# Patient Record
Sex: Male | Born: 1985 | Race: Black or African American | Hispanic: No | Marital: Single | Smoking: Never smoker
Health system: Southern US, Community
[De-identification: ages and names within clinical notes are randomized; demographics above are authoritative.]

## PROBLEM LIST (undated history)

## (undated) DIAGNOSIS — E669 Obesity, unspecified: Secondary | ICD-10-CM

## (undated) DIAGNOSIS — I10 Essential (primary) hypertension: Secondary | ICD-10-CM

---

## 2012-03-28 ENCOUNTER — Emergency Department (HOSPITAL_COMMUNITY)
Admission: EM | Admit: 2012-03-28 | Discharge: 2012-03-28 | Disposition: A | Discharge status: Home / Self Care | Payer: Self-pay | Attending: Emergency Medicine | Admitting: Emergency Medicine

## 2012-03-28 ENCOUNTER — Encounter (HOSPITAL_COMMUNITY): Payer: Self-pay | Admitting: *Deleted

## 2012-03-28 DIAGNOSIS — J020 Streptococcal pharyngitis: Secondary | ICD-10-CM | POA: Insufficient documentation

## 2012-03-28 DIAGNOSIS — I10 Essential (primary) hypertension: Secondary | ICD-10-CM | POA: Insufficient documentation

## 2012-03-28 HISTORY — DX: Essential (primary) hypertension: I10

## 2012-03-28 LAB — RAPID STREP SCREEN (MED CTR MEBANE ONLY): Streptococcus, Group A Screen (Direct): POSITIVE — AB

## 2012-03-28 MED ORDER — PENICILLIN V POTASSIUM 250 MG PO TABS
500.0000 mg | ORAL_TABLET | Freq: Once | ORAL | Status: AC
  Administered 2012-03-28: 500 mg via ORAL
  Filled 2012-03-28: qty 2

## 2012-03-28 MED ORDER — IBUPROFEN 800 MG PO TABS
800.0000 mg | ORAL_TABLET | Freq: Once | ORAL | Status: AC
  Administered 2012-03-28: 800 mg via ORAL
  Filled 2012-03-28: qty 1

## 2012-03-28 MED ORDER — PENICILLIN V POTASSIUM 500 MG PO TABS: 500.0000 mg | ORAL_TABLET | Freq: Two times a day (BID) | ORAL | Status: AC

## 2012-03-28 NOTE — ED Notes (Signed)
Pt c/o sore throat with productive clear cough since yesterday, states "i think I have a fever"

## 2012-03-28 NOTE — ED Provider Notes (Signed)
History     CSN: [id number]  Arrival date & time 03/28/12  0436   First MD Initiated Contact with Patient 03/28/12 [phone]      Chief Complaint  Patient presents with  . Sore Throat    (Consider location/radiation/quality/duration/timing/severity/associated sxs/prior treatment) HPI 26 year old male presents to emergency room with complaint of sore throat. Patient reports onset of sore throat this morning around 345 while he was at work. Patient also reports cold chills and body aches associated with sore throat. He denies any sick contacts no headache. Patient is felt subjective fevers. Patient has had some intermittent cough as well. No rash, no abdominal pain, no chest pain no other symptoms. Past Medical History  Diagnosis Date  . Hypertension     History reviewed. No pertinent past surgical history.  History reviewed. No pertinent family history.  History  Substance Use Topics  . Smoking status: Never Smoker   . Smokeless tobacco: Not on file  . Alcohol Use: Yes      Review of Systems  All other systems reviewed and are negative.   other than listed in history of present illness  Allergies  Review of patient's allergies indicates not on file.  Home Medications   Current Outpatient Rx  Name Route Sig Dispense Refill  . AMLODIPINE BESYLATE 10 MG PO TABS Oral Take 10 mg by mouth daily.      Pulse 118  Temp 98.2 F (36.8 C)  Resp 16  SpO2 98%  Physical Exam  Nursing note and vitals reviewed. Constitutional: He appears well-developed and well-nourished. No distress.       Morbidly obese in no acute distress  HENT:  Head: Normocephalic and atraumatic.  Right Ear: External ear normal.  Left Ear: External ear normal.  Nose: Nose normal.  Mouth/Throat: Oropharynx is clear and moist.       No exudate or erythema noted to posterior pharynx or tonsils  Eyes: Conjunctivae and EOM are normal. Pupils are equal, round, and reactive to light.  Neck: Normal range of  motion. Neck supple. No JVD present. No tracheal deviation present. No thyromegaly present.  Cardiovascular: Normal rate, regular rhythm, normal heart sounds and intact distal pulses.  Exam reveals no gallop and no friction rub.   No murmur heard. Pulmonary/Chest: Effort normal and breath sounds normal. No stridor. No respiratory distress. He has no wheezes. He has no rales. He exhibits no tenderness.  Abdominal: Soft. Bowel sounds are normal. He exhibits no distension and no mass. There is no tenderness. There is no rebound and no guarding.  Musculoskeletal: Normal range of motion. He exhibits no edema and no tenderness.  Lymphadenopathy:    He has no cervical adenopathy.  Skin: Skin is warm and dry. No rash noted. He is not diaphoretic. No erythema. No pallor.    ED Course  Procedures (including critical care time)  Labs Reviewed  RAPID STREP SCREEN - Abnormal; Notable for the following:    Streptococcus, Group A Screen (Direct) POSITIVE (*)    All other components within normal limits   No results found.   1. Strep pharyngitis       MDM  26 year-old with acute onset of myalgias, sore throat. I have sent a strep screen, but feel this is more likely a viral syndrome.        Olivia Mackie, MD 03/28/12 [phone]

## 2012-03-28 NOTE — Discharge Instructions (Signed)
Take antibiotics as prescribed. Followup with your doctor for recheck in 3-5 days. Return to emergency department for worsening condition or new concerning symptoms.  Strep Infections Streptococcal (strep) infections are caused by streptococcal germs (bacteria). Strep infections are very contagious. Strep infections can occur in:  Ears.   The nose.   The throat.   Sinuses.   Skin.   Blood.   Lungs.   Spinal fluid.   Urine.  Strep throat is the most common bacterial infection in children. The symptoms of a Strep infection usually get better in 2 to 3 days after starting medicine that kills germs (antibiotics). Strep is usually not contagious after 36 to 48 hours of antibiotic treatment. Strep infections that are not treated can cause serious complications. These include gland infections, throat abscess, rheumatic fever and kidney disease. DIAGNOSIS  The diagnosis of strep is made by:  A culture for the strep germ.  TREATMENT  These infections require oral antibiotics for a full 10 days, an antibiotic shot or antibiotics given into the vein (intravenous, IV). HOME CARE INSTRUCTIONS   Be sure to finish all antibiotics even if feeling better.   Only take over-the-counter medicines for pain, discomfort and or fever, as directed by your caregiver.   Close contacts that have a fever, sore throat or illness symptoms should see their caregiver right away.   You or your child may return to work, school or daycare if the fever and pain are better in 2 to 3 days after starting antibiotics.  SEEK MEDICAL CARE IF:   You or your child has an oral temperature above 102 F (38.9 C).   Your baby is older than 3 months with a rectal temperature of 100.5 F (38.1 C) or higher for more than 1 day.   You or your child is not better in 3 days.  SEEK IMMEDIATE MEDICAL CARE IF:   You or your child has an oral temperature above 102 F (38.9 C), not controlled by medicine.   Your baby is  older than 3 months with a rectal temperature of 102 F (38.9 C) or higher.   Your baby is 87 months old or younger with a rectal temperature of 100.4 F (38 C) or higher.   There is a spreading rash.   There is difficulty swallowing or breathing.   There is increased pain or swelling.  Document Released: 11/17/2004 Document Revised: 09/29/2011 Document Reviewed: 08/26/2009 [facility] Patient Information 2012 [place], [place].

## 2012-03-28 NOTE — ED Notes (Signed)
PT ambulated with a  Steady gait; VSS; A&Ox3; no signs of distress; respirations even and unlabored; skin warm and dry; no questions at this time.  

## 2012-10-25 ENCOUNTER — Emergency Department (HOSPITAL_COMMUNITY): Payer: No Typology Code available for payment source

## 2012-10-25 ENCOUNTER — Emergency Department (HOSPITAL_COMMUNITY)
Admission: EM | Admit: 2012-10-25 | Discharge: 2012-10-26 | Disposition: A | Discharge status: Home / Self Care | Payer: No Typology Code available for payment source | Attending: Emergency Medicine | Admitting: Emergency Medicine

## 2012-10-25 ENCOUNTER — Encounter (HOSPITAL_COMMUNITY): Payer: Self-pay | Admitting: Emergency Medicine

## 2012-10-25 DIAGNOSIS — J029 Acute pharyngitis, unspecified: Secondary | ICD-10-CM | POA: Insufficient documentation

## 2012-10-25 DIAGNOSIS — I10 Essential (primary) hypertension: Secondary | ICD-10-CM | POA: Insufficient documentation

## 2012-10-25 DIAGNOSIS — J189 Pneumonia, unspecified organism: Secondary | ICD-10-CM

## 2012-10-25 DIAGNOSIS — R059 Cough, unspecified: Secondary | ICD-10-CM | POA: Insufficient documentation

## 2012-10-25 DIAGNOSIS — R05 Cough: Secondary | ICD-10-CM | POA: Insufficient documentation

## 2012-10-25 DIAGNOSIS — R5381 Other malaise: Secondary | ICD-10-CM | POA: Insufficient documentation

## 2012-10-25 DIAGNOSIS — Z79899 Other long term (current) drug therapy: Secondary | ICD-10-CM | POA: Insufficient documentation

## 2012-10-25 MED ORDER — DEXAMETHASONE SODIUM PHOSPHATE 10 MG/ML IJ SOLN
10.0000 mg | Freq: Once | INTRAMUSCULAR | Status: AC
  Administered 2012-10-25: 10 mg via INTRAMUSCULAR
  Filled 2012-10-25: qty 1

## 2012-10-25 MED ORDER — AMOXICILLIN 500 MG PO CAPS
1000.0000 mg | ORAL_CAPSULE | Freq: Once | ORAL | Status: AC
  Administered 2012-10-25: 1000 mg via ORAL
  Filled 2012-10-25: qty 2

## 2012-10-25 MED ORDER — AZITHROMYCIN 250 MG PO TABS: 500.0000 mg | ORAL_TABLET | Freq: Once | ORAL | Status: DC

## 2012-10-25 MED ORDER — ALBUTEROL SULFATE (5 MG/ML) 0.5% IN NEBU
5.0000 mg | INHALATION_SOLUTION | Freq: Once | RESPIRATORY_TRACT | Status: AC
  Administered 2012-10-25: 5 mg via RESPIRATORY_TRACT
  Filled 2012-10-25: qty 0.5

## 2012-10-25 MED ORDER — AZITHROMYCIN 250 MG PO TABS
500.0000 mg | ORAL_TABLET | Freq: Once | ORAL | Status: AC
  Administered 2012-10-25: 500 mg via ORAL
  Filled 2012-10-25: qty 2

## 2012-10-25 MED ORDER — IBUPROFEN 800 MG PO TABS
800.0000 mg | ORAL_TABLET | Freq: Once | ORAL | Status: AC
  Administered 2012-10-25: 800 mg via ORAL
  Filled 2012-10-25: qty 1

## 2012-10-25 NOTE — ED Notes (Signed)
Patient transported to X-ray 

## 2012-10-25 NOTE — ED Notes (Signed)
Patient complaining of cold/flu symptoms.  Reports sore throat, nasal congestion, and cough.  Unknown if he has been running a fever; denies generalized body aches, chest pain, shortness of breath, and chills.  Patient reports that he has been around a lot of sick people at his work.  States that he has not had the flu shot.

## 2012-10-26 MED ORDER — AMOXICILLIN 500 MG PO CAPS: 1000.0000 mg | ORAL_CAPSULE | Freq: Two times a day (BID) | ORAL | Status: DC

## 2012-10-26 MED ORDER — AZITHROMYCIN 250 MG PO TABS: 250.0000 mg | ORAL_TABLET | Freq: Every day | ORAL | Status: DC

## 2012-10-26 NOTE — ED Provider Notes (Signed)
History     CSN: [id number]  Arrival date & time 10/25/12  2109   First MD Initiated Contact with Patient 10/25/12 2140      Chief Complaint  Patient presents with  . Sore Throat  . Nasal Congestion    (Consider location/radiation/quality/duration/timing/severity/associated sxs/prior treatment) Patient is a 27 y.o. male presenting with pharyngitis. The history is provided by the patient. No language interpreter was used.  Sore Throat The current episode started in the past 7 days. The problem occurs daily. The problem has been gradually worsening. Associated symptoms include coughing and fatigue. Pertinent negatives include no abdominal pain, chest pain, fever, nausea, vomiting or weakness.   26yo morbidly obese male with upper respiratory symptoms x 8 days with sore throat cough and nasal congestion. pmh of hypertension.  Denies fever, nausea and vomiting.  Hypertensive tonight. Non compliant with norvasc today. No stroke symptoms. Neuro in tact.   Past Medical History  Diagnosis Date  . Hypertension     History reviewed. No pertinent past surgical history.  History reviewed. No pertinent family history.  History  Substance Use Topics  . Smoking status: Never Smoker   . Smokeless tobacco: Not on file  . Alcohol Use: Yes      Review of Systems  Constitutional: Positive for fatigue. Negative for fever.  HENT: Negative.   Eyes: Negative.   Respiratory: Positive for cough. Negative for shortness of breath.   Cardiovascular: Negative.  Negative for chest pain and leg swelling.  Gastrointestinal: Negative.  Negative for nausea, vomiting and abdominal pain.  Neurological: Negative.  Negative for weakness.  Psychiatric/Behavioral: Negative.   All other systems reviewed and are negative.    Allergies  Review of patient's allergies indicates no known allergies.  Home Medications   Current Outpatient Rx  Name  Route  Sig  Dispense  Refill  . AMLODIPINE BESYLATE 10 MG  PO TABS   Oral   Take 10 mg by mouth daily.         Marland Kitchen PHENYLEPH-DOXYLAMINE-DM-APAP 5-6.25-10-325 MG PO CAPS   Oral   Take 2 capsules by mouth at bedtime as needed. For cold symptoms           BP 179/114  Pulse 102  Temp 98.2 F (36.8 C) (Oral)  Resp 16  SpO2 100%  Physical Exam  Nursing note and vitals reviewed. Constitutional: He is oriented to person, place, and time. He appears well-developed and well-nourished.  HENT:  Head: Normocephalic.  Eyes: Conjunctivae normal and EOM are normal. Pupils are equal, round, and reactive to light.  Neck: Normal range of motion. Neck supple.  Cardiovascular: Normal rate and normal heart sounds.   Pulmonary/Chest: Effort normal.  Abdominal: Soft.  Musculoskeletal: Normal range of motion.  Neurological: He is alert and oriented to person, place, and time.  Skin: Skin is warm and dry.  Psychiatric: He has a normal mood and affect.    ED Course  Procedures (including critical care time)   Labs Reviewed  RAPID STREP SCREEN   Dg Chest 2 View  10/25/2012  *RADIOLOGY REPORT*  Clinical Data: Sore throat and nasal congestion.  CHEST - 2 VIEW  Comparison: None.  Findings: The heart size and mediastinal contours are normal. There is focal airspace disease in the right upper lobe consistent with pneumonia.  The lungs are otherwise clear.  There is no pleural effusion.  IMPRESSION: Focal right upper lobe pneumonia.   Original Report Authenticated By: Carey Bullocks, M.D.  No diagnosis found.    MDM  RU lobe pneumonia on x-ray reviewed by myself.  Albuterol neb, amoxicillin, zithromax and decadron in ER.  Sore throat with swelling - for strep.  Rx for azithromycin and amoxicillin.  He will follow up with his pcp in [place] tomorrow.  Work note.  Shared visit with Dr. Preston Fleeting. He will take his norvasc when he gets home.         Remi Haggard, NP 10/26/12 0234  Remi Haggard, NP 10/26/12 [phone]

## 2012-10-27 NOTE — ED Provider Notes (Signed)
Medical screening examination/treatment/procedure(s) were performed by non-physician practitioner and as supervising physician I was immediately available for consultation/collaboration.   Dione Booze, MD 10/27/12 [phone]

## 2013-04-14 ENCOUNTER — Encounter (HOSPITAL_BASED_OUTPATIENT_CLINIC_OR_DEPARTMENT_OTHER): Payer: Self-pay

## 2013-04-14 ENCOUNTER — Emergency Department (HOSPITAL_BASED_OUTPATIENT_CLINIC_OR_DEPARTMENT_OTHER)
Admission: EM | Admit: 2013-04-14 | Discharge: 2013-04-14 | Disposition: A | Discharge status: Home / Self Care | Payer: Self-pay | Attending: Emergency Medicine | Admitting: Emergency Medicine

## 2013-04-14 DIAGNOSIS — Z9114 Patient's other noncompliance with medication regimen: Secondary | ICD-10-CM

## 2013-04-14 DIAGNOSIS — K5289 Other specified noninfective gastroenteritis and colitis: Secondary | ICD-10-CM | POA: Insufficient documentation

## 2013-04-14 DIAGNOSIS — K529 Noninfective gastroenteritis and colitis, unspecified: Secondary | ICD-10-CM

## 2013-04-14 DIAGNOSIS — Z9119 Patient's noncompliance with other medical treatment and regimen: Secondary | ICD-10-CM | POA: Insufficient documentation

## 2013-04-14 DIAGNOSIS — Z91199 Patient's noncompliance with other medical treatment and regimen due to unspecified reason: Secondary | ICD-10-CM | POA: Insufficient documentation

## 2013-04-14 DIAGNOSIS — Z79899 Other long term (current) drug therapy: Secondary | ICD-10-CM | POA: Insufficient documentation

## 2013-04-14 DIAGNOSIS — I10 Essential (primary) hypertension: Secondary | ICD-10-CM | POA: Insufficient documentation

## 2013-04-14 DIAGNOSIS — R197 Diarrhea, unspecified: Secondary | ICD-10-CM | POA: Insufficient documentation

## 2013-04-14 LAB — BASIC METABOLIC PANEL
BUN: 12 mg/dL (ref 6–23)
Chloride: 101 mEq/L (ref 96–112)
Creatinine, Ser: 1 mg/dL (ref 0.50–1.35)
GFR calc Af Amer: >90 mL/min (ref >90)

## 2013-04-14 MED ORDER — POTASSIUM CHLORIDE CRYS ER 20 MEQ PO TBCR
40.0000 meq | EXTENDED_RELEASE_TABLET | Freq: Once | ORAL | Status: AC
  Administered 2013-04-14: 40 meq via ORAL
  Filled 2013-04-14: qty 2

## 2013-04-14 MED ORDER — HYDROCHLOROTHIAZIDE 25 MG PO TABS
25.0000 mg | ORAL_TABLET | Freq: Once | ORAL | Status: AC
  Administered 2013-04-14: 25 mg via ORAL
  Filled 2013-04-14: qty 1

## 2013-04-14 MED ORDER — POTASSIUM CHLORIDE 20 MEQ/15ML (10%) PO LIQD
20.0000 meq | Freq: Once | ORAL | Status: AC
  Administered 2013-04-14: 20 meq via ORAL
  Filled 2013-04-14: qty 15

## 2013-04-14 MED ORDER — POTASSIUM CHLORIDE CRYS ER 20 MEQ PO TBCR: 20.0000 meq | EXTENDED_RELEASE_TABLET | Freq: Two times a day (BID) | ORAL | Status: DC

## 2013-04-14 MED ORDER — SODIUM CHLORIDE 0.9 % IV BOLUS (SEPSIS)
1000.0000 mL | Freq: Once | INTRAVENOUS | Status: AC
  Administered 2013-04-14: 1000 mL via INTRAVENOUS

## 2013-04-14 MED ORDER — HYDROCHLOROTHIAZIDE 25 MG PO TABS: 25.0000 mg | ORAL_TABLET | Freq: Every day | ORAL | Status: DC

## 2013-04-14 MED ORDER — METOPROLOL SUCCINATE ER 100 MG PO TB24: 100.0000 mg | ORAL_TABLET | Freq: Every day | ORAL | Status: DC

## 2013-04-14 NOTE — ED Provider Notes (Signed)
History     CSN: [id number]  Arrival date & time 04/14/13  0201   First MD Initiated Contact with Patient 04/14/13 0232      Chief Complaint  Patient presents with  . Headache    (Consider location/radiation/quality/duration/timing/severity/associated sxs/prior treatment) HPI This is a 27 year old male with history of hypertension. He has not taken his antihypertensives in over a month due to finances. He is here with nausea, vomiting and diarrhea for the 3 days. The nausea and vomiting have resolved but he continues to have diarrhea. The symptoms have been moderate. He is also had an intermittent headache during this time, which responds to Motrin. He denies abdominal pain, chest pain, shortness of breath, blurred vision or any focal neurologic deficit.  Past Medical History  Diagnosis Date  . Hypertension     History reviewed. No pertinent past surgical history.  No family history on file.  History  Substance Use Topics  . Smoking status: Never Smoker   . Smokeless tobacco: Not on file  . Alcohol Use: Yes      Review of Systems  All other systems reviewed and are negative.    Allergies  Review of patient's allergies indicates no known allergies.  Home Medications   Current Outpatient Rx  Name  Route  Sig  Dispense  Refill  . amLODipine (NORVASC) 10 MG tablet   Oral   Take 10 mg by mouth daily.         Marland Kitchen olmesartan-hydrochlorothiazide (BENICAR HCT) 40-25 MG per tablet   Oral   Take 1 tablet by mouth daily.           BP 196/120  Pulse 93  Temp(Src) 98.7 F (37.1 C) (Oral)  Resp 16  Wt 364 lb (165.109 kg)  SpO2 100%  Physical Exam General: Well-developed, morbidly obese male in no acute distress; appearance consistent with age of record HENT: normocephalic, atraumatic Eyes: pupils equal round and reactive to light; extraocular muscles intact Neck: supple Heart: regular rate and rhythm Lungs: clear to auscultation bilaterally Abdomen: soft;  nondistended; nontender; bowel sounds present Extremities: No deformity; full range of motion Neurologic: Awake, alert and oriented; motor function intact in all extremities and symmetric; no facial droop Skin: Warm and dry Psychiatric: Normal mood and affect    ED Course  Procedures (including critical care time)     MDM   Nursing notes and vitals signs, including pulse oximetry, reviewed.  Summary of this visit's results, reviewed by myself:  Labs:  Results for orders placed during the hospital encounter of 04/14/13 (from the past 24 hour(s))  BASIC METABOLIC PANEL     Status: Abnormal   Collection Time    04/14/13  2:41 AM      Result Value Range   Sodium 139  135 - 145 mEq/L   Potassium 2.9 (*) 3.5 - 5.1 mEq/L   Chloride 101  96 - 112 mEq/L   CO2 27  19 - 32 mEq/L   Glucose, Bld 110 (*) 70 - 99 mg/dL   BUN 12  6 - 23 mg/dL   Creatinine, Ser 4.09  0.50 - 1.35 mg/dL   Calcium 9.4  8.4 - 81.1 mg/dL   GFR calc non Af Amer >90  >90 mL/min   GFR calc Af Amer >90  >90 mL/min   4:13 AM Feels better after IV fluid bolus. We will replete his potassium. We'll restart him on antihypertensives.  Hanley Seamen, MD 04/14/13 [phone]

## 2013-04-14 NOTE — ED Notes (Signed)
Patient here with generalized headache since Thursday that he describes as intermittent. Also reports nausea and diarrhea for same. Has not taken BP meds x 1 month due to financial reasons

## 2013-08-26 ENCOUNTER — Emergency Department (HOSPITAL_COMMUNITY)
Admission: EM | Admit: 2013-08-26 | Discharge: 2013-08-26 | Disposition: A | Discharge status: Home / Self Care | Payer: PRIVATE HEALTH INSURANCE | Attending: Emergency Medicine | Admitting: Emergency Medicine

## 2013-08-26 ENCOUNTER — Encounter (HOSPITAL_COMMUNITY): Payer: Self-pay | Admitting: Emergency Medicine

## 2013-08-26 DIAGNOSIS — H109 Unspecified conjunctivitis: Secondary | ICD-10-CM

## 2013-08-26 DIAGNOSIS — I10 Essential (primary) hypertension: Secondary | ICD-10-CM | POA: Insufficient documentation

## 2013-08-26 DIAGNOSIS — Z79899 Other long term (current) drug therapy: Secondary | ICD-10-CM | POA: Insufficient documentation

## 2013-08-26 MED ORDER — POLYMYXIN B-TRIMETHOPRIM 10000-0.1 UNIT/ML-% OP SOLN
1.0000 [drp] | OPHTHALMIC | Status: DC
  Filled 2013-08-26: qty 10

## 2013-08-26 MED ORDER — ERYTHROMYCIN 5 MG/GM OP OINT
TOPICAL_OINTMENT | Freq: Every day | OPHTHALMIC | Status: DC
  Filled 2013-08-26: qty 3.5

## 2013-08-26 NOTE — ED Notes (Signed)
Pt c/o right eye itching and drainage starting on yesterday.

## 2013-08-26 NOTE — ED Provider Notes (Addendum)
CSN: [id number]     Arrival date & time 08/26/13  1045 History   First MD Initiated Contact with Patient 08/26/13 1130     Chief Complaint  Patient presents with  . Conjunctivitis   (Consider location/radiation/quality/duration/timing/severity/associated sxs/prior Treatment) Patient is a 27 y.o. male presenting with conjunctivitis.  Conjunctivitis This is a new problem. The current episode started yesterday. The problem occurs constantly. The problem has been gradually worsening. Associated symptoms comments: Redness, itching and draining of the right eye. Nothing aggravates the symptoms. Nothing relieves the symptoms. He has tried nothing for the symptoms. The treatment provided no relief.    Past Medical History  Diagnosis Date  . Hypertension    History reviewed. No pertinent past surgical history. History reviewed. No pertinent family history. History  Substance Use Topics  . Smoking status: Never Smoker   . Smokeless tobacco: Not on file  . Alcohol Use: Yes    Review of Systems  Eyes: Positive for discharge, redness and itching.  All other systems reviewed and are negative.    Allergies  Review of patient's allergies indicates no known allergies.  Home Medications   Current Outpatient Rx  Name  Route  Sig  Dispense  Refill  . hydrochlorothiazide (HYDRODIURIL) 25 MG tablet   Oral   Take 25 mg by mouth daily.         . metoprolol succinate (TOPROL-XL) 100 MG 24 hr tablet   Oral   Take 100 mg by mouth daily. Take with or immediately following a meal.          BP 217/119  Pulse 96  Temp(Src) 97.8 F (36.6 C) (Oral)  Resp 18  Ht 5\' 11"  (1.803 m)  Wt 364 lb (165.109 kg)  BMI 50.79 kg/m2  SpO2 99% Physical Exam  Nursing note and vitals reviewed. Constitutional: He is oriented to person, place, and time. He appears well-developed and well-nourished. No distress.  HENT:  Head: Normocephalic and atraumatic.  Eyes: EOM are normal. Pupils are equal, round,  and reactive to light. Right eye exhibits discharge and exudate. Left eye exhibits no chemosis, no exudate and no hordeolum. No foreign body present in the left eye. Right conjunctiva is injected. Left conjunctiva is not injected. Left conjunctiva has no hemorrhage.  Cardiovascular: Normal rate.   Pulmonary/Chest: Effort normal.  Neurological: He is alert and oriented to person, place, and time.  Skin: Skin is warm and dry.  Psychiatric: He has a normal mood and affect. His behavior is normal.    ED Course  Procedures (including critical care time) Labs Review Labs Reviewed - No data to display Imaging Review No results found.  EKG Interpretation   None       MDM   1. Hypertension   2. Conjunctivitis     Patient with evidence of uncomplicated right conjunctivitis. Patient given medications for what appears to be an infectious conjunctivitis. Patient also is hypertensive here however he denies headache, chest pain, shortness of breath. He has taken his hydrochlorothiazide and metoprolol but states that his blood pressure always runs high and he has not checked it lately. Discussed with him if blood pressure remains elevated he needs to followup with his PCP for ongoing management. Do not feel patient needs any further treatment today for blood pressure    Gwyneth Sprout, MD 08/26/13 1211  Gwyneth Sprout, MD 08/26/13 1213

## 2013-08-26 NOTE — Progress Notes (Signed)
[facility] provided pt with a list of primary care resources and a [facility] [employer] [facility] application.  °

## 2014-04-25 ENCOUNTER — Encounter (HOSPITAL_COMMUNITY): Payer: Self-pay | Admitting: Emergency Medicine

## 2014-04-25 ENCOUNTER — Emergency Department (HOSPITAL_COMMUNITY)
Admission: EM | Admit: 2014-04-25 | Discharge: 2014-04-26 | Disposition: A | Discharge status: Home / Self Care | Payer: PRIVATE HEALTH INSURANCE | Attending: Emergency Medicine | Admitting: Emergency Medicine

## 2014-04-25 DIAGNOSIS — R05 Cough: Secondary | ICD-10-CM | POA: Insufficient documentation

## 2014-04-25 DIAGNOSIS — I10 Essential (primary) hypertension: Secondary | ICD-10-CM | POA: Insufficient documentation

## 2014-04-25 DIAGNOSIS — J020 Streptococcal pharyngitis: Secondary | ICD-10-CM

## 2014-04-25 DIAGNOSIS — R059 Cough, unspecified: Secondary | ICD-10-CM | POA: Insufficient documentation

## 2014-04-25 DIAGNOSIS — R509 Fever, unspecified: Secondary | ICD-10-CM | POA: Insufficient documentation

## 2014-04-25 DIAGNOSIS — Z79899 Other long term (current) drug therapy: Secondary | ICD-10-CM | POA: Insufficient documentation

## 2014-04-25 DIAGNOSIS — R51 Headache: Secondary | ICD-10-CM | POA: Insufficient documentation

## 2014-04-25 LAB — RAPID STREP SCREEN (MED CTR MEBANE ONLY): STREPTOCOCCUS, GROUP A SCREEN (DIRECT): POSITIVE — AB

## 2014-04-25 MED ORDER — PENICILLIN G BENZATHINE 1200000 UNIT/2ML IM SUSP
1.2000 10*6.[IU] | Freq: Once | INTRAMUSCULAR | Status: AC
  Administered 2014-04-25: 1.2 10*6.[IU] via INTRAMUSCULAR
  Filled 2014-04-25: qty 2

## 2014-04-25 NOTE — ED Provider Notes (Signed)
CSN: [id number]     Arrival date & time 04/25/14  2242 History  This chart was scribed for non-physician provider Jaynie Crumbleatyana Jerrald Doverspike, PA-C, working with Derwood KaplanAnkit Nanavati, MD by Phillis HaggisGabriella Gaje, ED Scribe. This patient was seen in room WTR9/WTR9 and patient care was started at 11:39 PM.    Chief Complaint  Patient presents with  . Sore Throat  . Flu like symptoms    The history is provided by the patient. No language interpreter was used.  HPI Comments: Edwin Lam is a 28 y.o. Male with a history of HTN and strep throat who presents to the Emergency Department complaining of fever, headache, cough, rhinorrhea, sore throat, chills and body aches onset two days ago. He reports that he has had associated pain with swallowing. He reports that he has been taking ibuprofen for the pain and fever. He denies smoking.    Past Medical History  Diagnosis Date  . Hypertension    History reviewed. No pertinent past surgical history. No family history on file. History  Substance Use Topics  . Smoking status: Never Smoker   . Smokeless tobacco: Not on file  . Alcohol Use: Yes    Review of Systems  Constitutional: Positive for fever and chills.  HENT: Positive for rhinorrhea and sore throat.   Respiratory: Positive for cough.   Musculoskeletal: Positive for myalgias.  Neurological: Positive for headaches.   Allergies  Review of patient's allergies indicates no known allergies.  Home Medications   Prior to Admission medications   Medication Sig Start Date End Date Taking? Authorizing Provider  hydrochlorothiazide (HYDRODIURIL) 25 MG tablet Take 25 mg by mouth daily. 04/14/13  Yes John L Molpus, MD  metoprolol (LOPRESSOR) 50 MG tablet Take 50 mg by mouth 2 (two) times daily.   Yes Historical Provider, MD   BP 156/103  Pulse 110  Temp(Src) 98.9 F (37.2 C) (Oral)  Resp 20  SpO2 97% Physical Exam  Nursing note and vitals reviewed. Constitutional: He is oriented to person, place, and time.  He appears well-developed and well-nourished.  HENT:  Head: Normocephalic and atraumatic.  Right Ear: External ear and ear canal normal.  Left Ear: Tympanic membrane, external ear and ear canal normal.  Nose: Nose normal.  Mouth/Throat: Uvula is midline and mucous membranes are normal. Posterior oropharyngeal erythema present. No oropharyngeal exudate, posterior oropharyngeal edema or tonsillar abscesses.  Eyes: Conjunctivae and EOM are normal.  Neck: Normal range of motion. Neck supple.  Cardiovascular: Normal rate, regular rhythm and normal heart sounds.  Exam reveals no gallop and no friction rub.   No murmur heard. Pulmonary/Chest: Effort normal and breath sounds normal. No respiratory distress. He has no wheezes. He has no rales. He exhibits no tenderness.  Musculoskeletal: Normal range of motion.  Neurological: He is alert and oriented to person, place, and time.  Skin: Skin is warm and dry.  Psychiatric: He has a normal mood and affect. His behavior is normal.    ED Course  Procedures (including critical care time) DIAGNOSTIC STUDIES: Oxygen Saturation is 97% on room air, normal by my interpretation.    COORDINATION OF CARE: 11:42 PM-Discussed treatment plan which includes salt water gargles and antibiotics with pt at bedside and pt agreed to plan.     Labs Review Labs Reviewed  RAPID STREP SCREEN - Abnormal; Notable for the following:    Streptococcus, Group A Screen (Direct) POSITIVE (*)    All other components within normal limits    Imaging Review No results  found.   EKG Interpretation None      MDM   Final diagnoses:  Strep pharyngitis    Patient with silk throat, fever, body aches. Here is afebrile, nontoxic appearing. No evidence of peritonsillar or retropharyngeal abscess. Patient is following with no problems. His strep screen is back positive. Patient requesting IM injection. Will treat with 1.2 million units of penicillin. Home with ibuprofen, salt  water gargles, follow up as needed.  Filed Vitals:   04/25/14 2256 04/25/14 2351  BP: 156/103   Pulse: 110 98  Temp: 98.9 F (37.2 C)   TempSrc: Oral   Resp: 20   SpO2: 97% 98%    I personally performed the services described in this documentation, which was scribed in my presence. The recorded information has been reviewed and is accurate.    Lottie Musselatyana A Margarette Vannatter, PA-C 04/26/14 [phone]

## 2014-04-25 NOTE — ED Notes (Signed)
Pt c/o headache, sore throat, fever, chills, body aches since Wed night. Pt also c/o productive cough. Pt denies n/v, but states he has had diarrhea since this morning. Pt alert, no acute distress.

## 2014-04-26 MED ORDER — IBUPROFEN 600 MG PO TABS: 600.0000 mg | ORAL_TABLET | Freq: Four times a day (QID) | ORAL | Status: DC | PRN

## 2014-04-26 NOTE — Discharge Instructions (Signed)
Ibuprofen and tylenol for pain and fever. Follow up if your symptoms are not improving or return to ER if worsening.    Strep Throat Strep throat is an infection of the throat caused by a bacteria named Streptococcus pyogenes. Your caregiver may call the infection streptococcal "tonsillitis" or "pharyngitis" depending on whether there are signs of inflammation in the tonsils or back of the throat. Strep throat is most common in children aged 27-15 years during the cold months of the year, but it can occur in people of any age during any season. This infection is spread from person to person (contagious) through coughing, sneezing, or other close contact. SYMPTOMS   Fever or chills.  Painful, swollen, red tonsils or throat.  Pain or difficulty when swallowing.  White or yellow spots on the tonsils or throat.  Swollen, tender lymph nodes or "glands" of the neck or under the jaw.  Red rash all over the body (rare). DIAGNOSIS  Many different infections can cause the same symptoms. A test must be done to confirm the diagnosis so the right treatment can be given. A "rapid strep test" can help your caregiver make the diagnosis in a few minutes. If this test is not available, a light swab of the infected area can be used for a throat culture test. If a throat culture test is done, results are usually available in a day or two. TREATMENT  Strep throat is treated with antibiotic medicine. HOME CARE INSTRUCTIONS   Gargle with 1 tsp of salt in 1 cup of warm water, 3-4 times per day or as needed for comfort.  Family members who also have a sore throat or fever should be tested for strep throat and treated with antibiotics if they have the strep infection.  Make sure everyone in your household washes their hands well.  Do not share food, drinking cups, or personal items that could cause the infection to spread to others.  You may need to eat a soft food diet until your sore throat gets  better.  Drink enough water and fluids to keep your urine clear or pale yellow. This will help prevent dehydration.  Get plenty of rest.  Stay home from school, daycare, or work until you have been on antibiotics for 24 hours.  Only take over-the-counter or prescription medicines for pain, discomfort, or fever as directed by your caregiver.  If antibiotics are prescribed, take them as directed. Finish them even if you start to feel better. SEEK MEDICAL CARE IF:   The glands in your neck continue to enlarge.  You develop a rash, cough, or earache.  You cough up green, yellow-brown, or bloody sputum.  You have pain or discomfort not controlled by medicines.  Your problems seem to be getting worse rather than better. SEEK IMMEDIATE MEDICAL CARE IF:   You develop any new symptoms such as vomiting, severe headache, stiff or painful neck, chest pain, shortness of breath, or trouble swallowing.  You develop severe throat pain, drooling, or changes in your voice.  You develop swelling of the neck, or the skin on the neck becomes red and tender.  You have a fever.  You develop signs of dehydration, such as fatigue, dry mouth, and decreased urination.  You become increasingly sleepy, or you cannot wake up completely. Document Released: 10/07/2000 Document Revised: 09/26/2012 Document Reviewed: 12/09/2010 [facility] Patient Information 2015 [place], [place]. This information is not intended to replace advice given to you by your health care provider. Make sure you  discuss any questions you have with your health care provider. ° °

## 2014-04-26 NOTE — ED Provider Notes (Signed)
Medical screening examination/treatment/procedure(s) were performed by non-physician practitioner and as supervising physician I was immediately available for consultation/collaboration.  Megan E Docherty, MD 04/26/14 1116 

## 2014-07-24 ENCOUNTER — Emergency Department (HOSPITAL_COMMUNITY)
Admission: EM | Admit: 2014-07-24 | Discharge: 2014-07-24 | Disposition: A | Discharge status: Home / Self Care | Payer: PRIVATE HEALTH INSURANCE | Attending: Emergency Medicine | Admitting: Emergency Medicine

## 2014-07-24 ENCOUNTER — Encounter (HOSPITAL_COMMUNITY): Payer: Self-pay | Admitting: Emergency Medicine

## 2014-07-24 DIAGNOSIS — J020 Streptococcal pharyngitis: Secondary | ICD-10-CM | POA: Insufficient documentation

## 2014-07-24 DIAGNOSIS — Z79899 Other long term (current) drug therapy: Secondary | ICD-10-CM | POA: Insufficient documentation

## 2014-07-24 DIAGNOSIS — I10 Essential (primary) hypertension: Secondary | ICD-10-CM | POA: Insufficient documentation

## 2014-07-24 LAB — RAPID STREP SCREEN (MED CTR MEBANE ONLY): STREPTOCOCCUS, GROUP A SCREEN (DIRECT): POSITIVE — AB

## 2014-07-24 MED ORDER — IBUPROFEN 800 MG PO TABS: 800.0000 mg | ORAL_TABLET | Freq: Three times a day (TID) | ORAL | Status: DC

## 2014-07-24 MED ORDER — PENICILLIN G BENZATHINE 1200000 UNIT/2ML IM SUSP
1.2000 10*6.[IU] | Freq: Once | INTRAMUSCULAR | Status: AC
  Administered 2014-07-24: 1.2 10*6.[IU] via INTRAMUSCULAR
  Filled 2014-07-24: qty 2

## 2014-07-24 MED ORDER — IBUPROFEN 800 MG PO TABS
800.0000 mg | ORAL_TABLET | Freq: Once | ORAL | Status: AC
  Administered 2014-07-24: 800 mg via ORAL
  Filled 2014-07-24: qty 1

## 2014-07-24 MED ORDER — LIDOCAINE VISCOUS 2 % MT SOLN
15.0000 mL | Freq: Once | OROMUCOSAL | Status: AC
  Administered 2014-07-24: 15 mL via OROMUCOSAL
  Filled 2014-07-24: qty 15

## 2014-07-24 MED ORDER — HYDROCODONE-HOMATROPINE 5-1.5 MG/5ML PO SYRP: 5.0000 mL | ORAL_SOLUTION | Freq: Four times a day (QID) | ORAL | Status: DC | PRN

## 2014-07-24 MED ORDER — DEXAMETHASONE 1 MG/ML PO CONC
10.0000 mg | Freq: Once | ORAL | Status: AC
Start: 2014-07-24 — End: 2014-07-24
  Administered 2014-07-24: 10 mg via ORAL
  Filled 2014-07-24: qty 10

## 2014-07-24 NOTE — ED Notes (Signed)
Pt reports nasal congestion, intermittent dry cough and sore throat for past few days. Reports lower back pain. No n/v, dysuria, fever or headache.

## 2014-07-24 NOTE — ED Provider Notes (Signed)
Medical screening examination/treatment/procedure(s) were performed by non-physician practitioner and as supervising physician I was immediately available for consultation/collaboration.   EKG Interpretation None       Doug SouSam Kinzy Weyers, MD 07/24/14 1503

## 2014-07-24 NOTE — Discharge Instructions (Signed)
Please follow up with your primary care physician in 1-2 days. If you do not have one please [facility] listed above. Please take the Hycodan syrup as prescribed, please be careful this may make you drowsy do not drive on this medicine. Please alternate between Motrin and Tylenol every three hours for fevers and pain. Please read all discharge instructions and return precautions.    Pharyngitis Pharyngitis is redness, pain, and swelling (inflammation) of your pharynx.  CAUSES  Pharyngitis is usually caused by infection. Most of the time, these infections are from viruses (viral) and are part of a cold. However, sometimes pharyngitis is caused by bacteria (bacterial). Pharyngitis can also be caused by allergies. Viral pharyngitis may be spread from person to person by coughing, sneezing, and personal items or utensils (cups, forks, spoons, toothbrushes). Bacterial pharyngitis may be spread from person to person by more intimate contact, such as kissing.  SIGNS AND SYMPTOMS  Symptoms of pharyngitis include:   Sore throat.   Tiredness (fatigue).   Low-grade fever.   Headache.  Joint pain and muscle aches.  Skin rashes.  Swollen lymph nodes.  Plaque-like film on throat or tonsils (often seen with bacterial pharyngitis). DIAGNOSIS  Your health care provider will ask you questions about your illness and your symptoms. Your medical history, along with a physical exam, is often all that is needed to diagnose pharyngitis. Sometimes, a rapid strep test is done. Other lab tests may also be done, depending on the suspected cause.  TREATMENT  Viral pharyngitis will usually get better in 3-4 days without the use of medicine. Bacterial pharyngitis is treated with medicines that kill germs (antibiotics).  HOME CARE INSTRUCTIONS   Drink enough water and fluids to keep your urine clear or pale yellow.   Only take over-the-counter or prescription medicines as  directed by your health care provider:   If you are prescribed antibiotics, make sure you finish them even if you start to feel better.   Do not take aspirin.   Get lots of rest.   Gargle with 8 oz of salt water ( tsp of salt per 1 qt of water) as often as every 1-2 hours to soothe your throat.   Throat lozenges (if you are not at risk for choking) or sprays may be used to soothe your throat. SEEK MEDICAL CARE IF:   You have large, tender lumps in your neck.  You have a rash.  You cough up green, yellow-brown, or bloody spit. SEEK IMMEDIATE MEDICAL CARE IF:   Your neck becomes stiff.  You drool or are unable to swallow liquids.  You vomit or are unable to keep medicines or liquids down.  You have severe pain that does not go away with the use of recommended medicines.  You have trouble breathing (not caused by a stuffy nose). MAKE SURE YOU:   Understand these instructions.  Will watch your condition.  Will get help right away if you are not doing well or get worse. Document Released: 10/10/2005 Document Revised: 07/31/2013 Document Reviewed: 06/17/2013 [facility] Patient Information 2015 [place], [place]. This information is not intended to replace advice given to you by your health care provider. Make sure you discuss any questions you have with your health care provider.

## 2014-07-24 NOTE — ED Provider Notes (Signed)
CSN: [id number]     Arrival date & time 07/24/14  1136 History  This chart was scribed for non-physician practitioner Francee Piccolo, PA-C, working with Doug Sou, MD by Littie Deeds, ED Scribe. This patient was seen in room WTR9/WTR9 and the patient's care was started at 12:00 PM.     Chief Complaint  Patient presents with  . URI      The history is provided by the patient. No language interpreter was used.   HPI Comments: Edwin Lam is a 28 y.o. male who presents to the Emergency Department complaining of intermittent dry cough and sore throat since yesterday. He took some alka seltzer that provided some relief to symptoms. Eating and drinking aggravate his pain. Patient states it feels like strep throat he has had in the past. He notes associated chills and congestion, and states that his voice has changed some. Patient denies nausea and vomiting. 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   Past Medical History  Diagnosis Date  . Hypertension    History reviewed. No pertinent past surgical history. History reviewed. No pertinent family history. History  Substance Use Topics  . Smoking status: Never Smoker   . Smokeless tobacco: Not on file  . Alcohol Use: Yes    Review of Systems  Constitutional: Positive for chills.  HENT: Positive for congestion and sore throat.   Respiratory: Positive for cough.   Gastrointestinal: Negative for nausea and vomiting.  All other systems reviewed and are negative.     Allergies  Review of patient's allergies indicates no known allergies.  Home Medications   Prior to Admission medications   Medication Sig Start Date End Date Taking? Authorizing Provider  hydrochlorothiazide (HYDRODIURIL) 25 MG tablet Take 25 mg by mouth daily. 04/14/13   John L Molpus, MD  HYDROcodone-homatropine (HYCODAN) 5-1.5 MG/5ML syrup Take 5 mLs by mouth every 6 (six) hours as needed for cough. 07/24/14   Naava Janeway L Nayib Remer, PA-C   ibuprofen (ADVIL,MOTRIN) 600 MG tablet Take 1 tablet (600 mg total) by mouth every 6 (six) hours as needed. 04/26/14   Tatyana A Kirichenko, PA-C  ibuprofen (ADVIL,MOTRIN) 800 MG tablet Take 1 tablet (800 mg total) by mouth 3 (three) times daily. 07/24/14   Joelie Schou L Teigen Bellin, PA-C  metoprolol (LOPRESSOR) 50 MG tablet Take 50 mg by mouth 2 (two) times daily.    Historical Provider, MD   BP 171/105  Pulse 103  Temp(Src) 97.9 F (36.6 C) (Oral)  Resp 20  SpO2 100% Physical Exam  Nursing note and vitals reviewed. Constitutional: He is oriented to person, place, and time. He appears well-developed and well-nourished. No distress.  HENT:  Head: Normocephalic and atraumatic.  Right Ear: External ear normal.  Left Ear: External ear normal.  Nose: Nose normal.  Mouth/Throat: Uvula is midline and mucous membranes are normal. No oral lesions. No trismus in the jaw. No dental abscesses or uvula swelling. Oropharyngeal exudate and posterior oropharyngeal erythema present.  Symmetric bilateral tonsillar swelling.   Eyes: Conjunctivae are normal. Pupils are equal, round, and reactive to light.  Neck: Normal range of motion. Neck supple.  Cardiovascular: Normal rate, regular rhythm and normal heart sounds.   Pulmonary/Chest: Effort normal and breath sounds normal.  Abdominal: Soft.  Musculoskeletal: Normal range of motion. He exhibits no edema.  Lymphadenopathy:    He has cervical adenopathy.  Neurological: He is alert and oriented to person, place, and time. No cranial nerve deficit.  Skin: Skin is warm  and dry. No rash noted. He is not diaphoretic.  Psychiatric: He has a normal mood and affect. His behavior is normal.    ED Course  Procedures Medications  lidocaine (XYLOCAINE) 2 % viscous mouth solution 15 mL (15 mLs Mouth/Throat Given 07/24/14 1226)  ibuprofen (ADVIL,MOTRIN) tablet 800 mg (800 mg Oral Given 07/24/14 1226)  dexamethasone (DECADRON) 1 MG/ML solution 10 mg (10 mg Oral Given  07/24/14 1230)  penicillin g benzathine (BICILLIN LA) 1200000 UNIT/2ML injection 1.2 Million Units (1.2 Million Units Intramuscular Given 07/24/14 1226)       Labs Review Labs Reviewed  RAPID STREP SCREEN - Abnormal; Notable for the following:    Streptococcus, Group A Screen (Direct) POSITIVE (*)    All other components within normal limits    Imaging Review No results found.   EKG Interpretation None      MDM   Final diagnoses:  Strep pharyngitis    Filed Vitals:   07/24/14 1150  BP: 171/105  Pulse:   Temp:   Resp:    Afebrile, NAD, non-toxic appearing, AAOx4. Pt afebrile with tonsillar exudate, cervical lymphadenopathy, & dysphagia; diagnosis of strep. Treated in the Ed with steroids, NSAIDs, Pain medication and PCN IM.  Pt appears mildly dehydrated, discussed importance of water rehydration. Presentation non concerning for PTA or infxn spread to soft tissue. No trismus or uvula deviation. Specific return precautions discussed. Pt able to drink water in ED without difficulty with intact air way. Recommended PCP follow up.      I personally performed the services described in this documentation, which was scribed in my presence. The recorded information has been reviewed and is accurate.     Lise AuerJennifer L Chrisotpher Rivero, PA-C 07/24/14 1308

## 2015-01-20 ENCOUNTER — Emergency Department (HOSPITAL_BASED_OUTPATIENT_CLINIC_OR_DEPARTMENT_OTHER): Payer: PRIVATE HEALTH INSURANCE

## 2015-01-20 ENCOUNTER — Emergency Department (HOSPITAL_BASED_OUTPATIENT_CLINIC_OR_DEPARTMENT_OTHER)
Admission: EM | Admit: 2015-01-20 | Discharge: 2015-01-20 | Disposition: A | Discharge status: Home / Self Care | Payer: PRIVATE HEALTH INSURANCE | Attending: Emergency Medicine | Admitting: Emergency Medicine

## 2015-01-20 ENCOUNTER — Encounter (HOSPITAL_BASED_OUTPATIENT_CLINIC_OR_DEPARTMENT_OTHER): Payer: Self-pay | Admitting: *Deleted

## 2015-01-20 DIAGNOSIS — I10 Essential (primary) hypertension: Secondary | ICD-10-CM | POA: Insufficient documentation

## 2015-01-20 DIAGNOSIS — J302 Other seasonal allergic rhinitis: Secondary | ICD-10-CM

## 2015-01-20 DIAGNOSIS — Z791 Long term (current) use of non-steroidal anti-inflammatories (NSAID): Secondary | ICD-10-CM | POA: Insufficient documentation

## 2015-01-20 DIAGNOSIS — R0602 Shortness of breath: Secondary | ICD-10-CM | POA: Insufficient documentation

## 2015-01-20 DIAGNOSIS — J309 Allergic rhinitis, unspecified: Secondary | ICD-10-CM | POA: Insufficient documentation

## 2015-01-20 DIAGNOSIS — Z79899 Other long term (current) drug therapy: Secondary | ICD-10-CM | POA: Insufficient documentation

## 2015-01-20 LAB — BASIC METABOLIC PANEL
Anion gap: 8 (ref 5–15)
BUN: 19 mg/dL (ref 6–23)
CHLORIDE: 101 mmol/L (ref 96–112)
CO2: 32 mmol/L (ref 19–32)
Calcium: 9 mg/dL (ref 8.4–10.5)
Creatinine, Ser: 1.21 mg/dL (ref 0.50–1.35)
GFR calc Af Amer: >90 mL/min (ref >90)
GFR calc non Af Amer: 81 mL/min — ABNORMAL LOW (ref >90)
Glucose, Bld: 119 mg/dL — ABNORMAL HIGH (ref 70–99)
Potassium: 3.2 mmol/L — ABNORMAL LOW (ref 3.5–5.1)
SODIUM: 141 mmol/L (ref 135–145)

## 2015-01-20 LAB — CBC WITH DIFFERENTIAL/PLATELET
Basophils Absolute: 0.1 10*3/uL (ref 0.0–0.1)
Basophils Relative: 1 % (ref 0–1)
EOS PCT: 2 % (ref 0–5)
Eosinophils Absolute: 0.2 10*3/uL (ref 0.0–0.7)
HCT: 42.1 % (ref 39.0–52.0)
HEMOGLOBIN: 12.8 g/dL — AB (ref 13.0–17.0)
LYMPHS ABS: 2.8 10*3/uL (ref 0.7–4.0)
LYMPHS PCT: 34 % (ref 12–46)
MCH: 23.1 pg — AB (ref 26.0–34.0)
MCHC: 30.4 g/dL (ref 30.0–36.0)
MCV: 76.1 fL — AB (ref 78.0–100.0)
MONO ABS: 0.8 10*3/uL (ref 0.1–1.0)
Monocytes Relative: 10 % (ref 3–12)
Neutro Abs: 4.3 10*3/uL (ref 1.7–7.7)
Neutrophils Relative %: 53 % (ref 43–77)
PLATELETS: 321 10*3/uL (ref 150–400)
RBC: 5.53 MIL/uL (ref 4.22–5.81)
RDW: 15.4 % (ref 11.5–15.5)
WBC: 8.1 10*3/uL (ref 4.0–10.5)

## 2015-01-20 MED ORDER — HYDROCHLOROTHIAZIDE 25 MG PO TABS: 25.0000 mg | ORAL_TABLET | Freq: Every day | ORAL | Status: DC

## 2015-01-20 MED ORDER — METOPROLOL TARTRATE 50 MG PO TABS: 50.0000 mg | ORAL_TABLET | Freq: Two times a day (BID) | ORAL | Status: DC

## 2015-01-20 NOTE — ED Provider Notes (Signed)
CSN: [id number]     Arrival date & time 01/20/15  1929 History  This chart was scribed for Gwyneth SproutWhitney Dominque Marlin, MD by Gwenyth Oberatherine Macek, ED Scribe. This patient was seen in room MH09/MH09 and the patient's care was started at 7:52 PM.    Chief Complaint  Patient presents with  . URI   The history is provided by the patient. No language interpreter was used.    HPI Comments: Edwin Lam is a 29 y.o. male with a history of HTN who presents to the Emergency Department complaining of constant, moderate chest congestion, sinus pressure that started 2 days ago. He states SOB and bilateral leg swelling that started 2 weeks ago as associated symptoms. Pt has a history of allergies. He has tried Claritin and an OTC medication with ephedrine with no relief. Pt states elevated BP since he ran out of medication 1 month ago. He denies smoking, a history of asthma and a history of leg swelling. He also denies nasal congestion and cough as associated symptoms.  Past Medical History  Diagnosis Date  . Hypertension    History reviewed. No pertinent past surgical history. No family history on file. History  Substance Use Topics  . Smoking status: Never Smoker   . Smokeless tobacco: Not on file  . Alcohol Use: Yes    Review of Systems  HENT: Negative for congestion.   Respiratory: Positive for chest tightness and shortness of breath. Negative for cough.   Cardiovascular: Positive for leg swelling.  All other systems reviewed and are negative.   Allergies  Review of patient's allergies indicates no known allergies.  Home Medications   Prior to Admission medications   Medication Sig Start Date End Date Taking? Authorizing Provider  hydrochlorothiazide (HYDRODIURIL) 25 MG tablet Take 25 mg by mouth daily. 04/14/13   John Molpus, MD  HYDROcodone-homatropine (HYCODAN) 5-1.5 MG/5ML syrup Take 5 mLs by mouth every 6 (six) hours as needed for cough. 07/24/14   Jennifer Piepenbrink, PA-C  ibuprofen  (ADVIL,MOTRIN) 600 MG tablet Take 1 tablet (600 mg total) by mouth every 6 (six) hours as needed. 04/26/14   Tatyana Kirichenko, PA-C  ibuprofen (ADVIL,MOTRIN) 800 MG tablet Take 1 tablet (800 mg total) by mouth 3 (three) times daily. 07/24/14   Jennifer Piepenbrink, PA-C  metoprolol (LOPRESSOR) 50 MG tablet Take 50 mg by mouth 2 (two) times daily.    Historical Provider, MD   BP 202/147 mmHg  Pulse 106  Temp(Src) 98.1 F (36.7 C) (Oral)  Resp 22  Ht 5\' 10"  (1.778 m)  Wt 401 lb 9 oz (182.148 kg)  BMI 57.62 kg/m2  SpO2 98% Physical Exam  Constitutional: He appears well-developed and well-nourished. No distress.  HENT:  Head: Normocephalic and atraumatic.  Right Ear: External ear normal.  Left Ear: External ear normal.  TMs clear; nasal turbinates swollen  Eyes: Conjunctivae and EOM are normal.  Neck: Neck supple. No tracheal deviation present.  Cardiovascular: Normal rate, regular rhythm and intact distal pulses.   No murmur heard. Pulmonary/Chest: Effort normal and breath sounds normal. No respiratory distress. He has no rales.  Breath sounds decreased due to body habitus  Musculoskeletal:  1+ pitting edema in bilateral LE to the ankle  Skin: Skin is warm and dry.  Psychiatric: He has a normal mood and affect. His behavior is normal.  Nursing note and vitals reviewed.   ED Course  Procedures   DIAGNOSTIC STUDIES: Oxygen Saturation is 98% on RA, normal by my interpretation.  COORDINATION OF CARE: 8:02 PM Discussed treatment plan with pt which includes lab work and a re-fill of blood pressure medications. Pt agreed to plan.   Labs Review Labs Reviewed  BASIC METABOLIC PANEL - Abnormal; Notable for the following:    Potassium 3.2 (*)    Glucose, Bld 119 (*)    GFR calc non Af Amer 81 (*)    All other components within normal limits  CBC WITH DIFFERENTIAL/PLATELET - Abnormal; Notable for the following:    Hemoglobin 12.8 (*)    MCV 76.1 (*)    MCH 23.1 (*)    All  other components within normal limits    Imaging Review Dg Chest 2 View  01/20/2015   CLINICAL DATA:  Cough and chest congestion.  EXAM: CHEST  2 VIEW  COMPARISON:  10/25/2012  FINDINGS: There is new cardiomegaly. There is peribronchial thickening. Pulmonary vascularity is normal. No consolidative infiltrates or effusions. No osseous abnormality.  IMPRESSION: Bronchitic changes.  New slight cardiomegaly.   Electronically Signed   By: Francene Boyers M.D.   On: 01/20/2015 20:39     EKG Interpretation None      MDM   Final diagnoses:  SOB (shortness of breath)  Essential hypertension  Seasonal allergies   patient here complaining of congestion and allergy type symptoms which she states are not improving with one dose of Claritin or an over-the-counter antihistamine. He states that his voice changes intermittently and he feels congestion in his chest. He denies fever, productive cough but has noticed some slight shortness of breath. Patient counseled and not using over-the-counter cold medications that have Sudafed as it will raise his blood pressure.   Also patient has a history of hypertension and has been taking antihypertensive medication but has been out for the last 30 days. In the last few weeks he's noticed swelling in his legs but is unsure of any weight change. Patient has mild edema peripherally clear breath sounds and no abdominal or chest pain. Will place patient back on his antihypertensive medications of HCTZ and metoprolol. Gave patient follow-up at the wellness clinic. Labs with normal creatinine and x-ray shows bronchitic changes  I personally performed the services described in this documentation, which was scribed in my presence.  The recorded information has been reviewed and considered.    Gwyneth Sprout, MD 01/20/15 2253

## 2015-01-20 NOTE — ED Notes (Addendum)
Facial pain, chest congestion for 2 days. His legs are swollen. He ran out of his BP medication 30 days ago.

## 2015-01-20 NOTE — Discharge Instructions (Signed)

## 2015-01-22 ENCOUNTER — Telehealth (HOSPITAL_COMMUNITY): Payer: Self-pay

## 2015-01-22 ENCOUNTER — Telehealth: Payer: Self-pay | Admitting: *Deleted

## 2015-01-22 NOTE — Telephone Encounter (Addendum)
Pt prescription attached to another pt instructions.

## 2015-09-26 ENCOUNTER — Emergency Department (HOSPITAL_BASED_OUTPATIENT_CLINIC_OR_DEPARTMENT_OTHER): Payer: Self-pay

## 2015-09-26 ENCOUNTER — Encounter (HOSPITAL_BASED_OUTPATIENT_CLINIC_OR_DEPARTMENT_OTHER): Payer: Self-pay | Admitting: Emergency Medicine

## 2015-09-26 ENCOUNTER — Inpatient Hospital Stay (HOSPITAL_BASED_OUTPATIENT_CLINIC_OR_DEPARTMENT_OTHER)
Admission: EM | Admit: 2015-09-26 | Discharge: 2015-10-01 | DRG: 292 | Disposition: A | Discharge status: Home / Self Care | Payer: PRIVATE HEALTH INSURANCE | Attending: Internal Medicine | Admitting: Internal Medicine

## 2015-09-26 DIAGNOSIS — Z9119 Patient's noncompliance with other medical treatment and regimen: Secondary | ICD-10-CM

## 2015-09-26 DIAGNOSIS — I509 Heart failure, unspecified: Secondary | ICD-10-CM | POA: Insufficient documentation

## 2015-09-26 DIAGNOSIS — Z6841 Body Mass Index (BMI) 40.0 and over, adult: Secondary | ICD-10-CM

## 2015-09-26 DIAGNOSIS — E785 Hyperlipidemia, unspecified: Secondary | ICD-10-CM | POA: Diagnosis present

## 2015-09-26 DIAGNOSIS — E669 Obesity, unspecified: Secondary | ICD-10-CM

## 2015-09-26 DIAGNOSIS — T502X5A Adverse effect of carbonic-anhydrase inhibitors, benzothiadiazides and other diuretics, initial encounter: Secondary | ICD-10-CM | POA: Diagnosis present

## 2015-09-26 DIAGNOSIS — I5023 Acute on chronic systolic (congestive) heart failure: Secondary | ICD-10-CM | POA: Diagnosis present

## 2015-09-26 DIAGNOSIS — Z9114 Patient's other noncompliance with medication regimen: Secondary | ICD-10-CM

## 2015-09-26 DIAGNOSIS — I161 Hypertensive emergency: Secondary | ICD-10-CM | POA: Diagnosis present

## 2015-09-26 DIAGNOSIS — E876 Hypokalemia: Secondary | ICD-10-CM | POA: Diagnosis present

## 2015-09-26 DIAGNOSIS — I10 Essential (primary) hypertension: Secondary | ICD-10-CM | POA: Diagnosis present

## 2015-09-26 DIAGNOSIS — I110 Hypertensive heart disease with heart failure: Principal | ICD-10-CM | POA: Diagnosis present

## 2015-09-26 DIAGNOSIS — Z8249 Family history of ischemic heart disease and other diseases of the circulatory system: Secondary | ICD-10-CM

## 2015-09-26 DIAGNOSIS — Z91199 Patient's noncompliance with other medical treatment and regimen due to unspecified reason: Secondary | ICD-10-CM

## 2015-09-26 DIAGNOSIS — R7303 Prediabetes: Secondary | ICD-10-CM | POA: Diagnosis present

## 2015-09-26 HISTORY — DX: Obesity, unspecified: E66.9

## 2015-09-26 LAB — COMPREHENSIVE METABOLIC PANEL WITH GFR
ALT: 26 U/L (ref 17–63)
AST: 23 U/L (ref 15–41)
Albumin: 3.8 g/dL (ref 3.5–5.0)
Alkaline Phosphatase: 52 U/L (ref 38–126)
Anion gap: 9 (ref 5–15)
BUN: 15 mg/dL (ref 6–20)
CO2: 30 mmol/L (ref 22–32)
Calcium: 8.7 mg/dL — ABNORMAL LOW (ref 8.9–10.3)
Chloride: 101 mmol/L (ref 101–111)
Creatinine, Ser: 1.11 mg/dL (ref 0.61–1.24)
GFR calc Af Amer: >60 mL/min
GFR calc non Af Amer: >60 mL/min
Glucose, Bld: 111 mg/dL — ABNORMAL HIGH (ref 65–99)
Potassium: 2.8 mmol/L — ABNORMAL LOW (ref 3.5–5.1)
Sodium: 140 mmol/L (ref 135–145)
Total Bilirubin: 1.3 mg/dL — ABNORMAL HIGH (ref 0.3–1.2)
Total Protein: 7.7 g/dL (ref 6.5–8.1)

## 2015-09-26 LAB — CBC WITH DIFFERENTIAL/PLATELET
BASOS ABS: 0.1 10*3/uL (ref 0.0–0.1)
BASOS PCT: 1 %
Eosinophils Absolute: 0.1 10*3/uL (ref 0.0–0.7)
Eosinophils Relative: 1 %
HEMATOCRIT: 42.3 % (ref 39.0–52.0)
HEMOGLOBIN: 13 g/dL (ref 13.0–17.0)
LYMPHS PCT: 41 %
Lymphs Abs: 3 10*3/uL (ref 0.7–4.0)
MCH: 23.5 pg — ABNORMAL LOW (ref 26.0–34.0)
MCHC: 30.7 g/dL (ref 30.0–36.0)
MCV: 76.5 fL — ABNORMAL LOW (ref 78.0–100.0)
Monocytes Absolute: 0.5 10*3/uL (ref 0.1–1.0)
Monocytes Relative: 7 %
NEUTROS ABS: 3.6 10*3/uL (ref 1.7–7.7)
Neutrophils Relative %: 50 %
Platelets: 339 10*3/uL (ref 150–400)
RBC: 5.53 MIL/uL (ref 4.22–5.81)
RDW: 15.1 % (ref 11.5–15.5)
WBC: 7.3 10*3/uL (ref 4.0–10.5)

## 2015-09-26 LAB — BRAIN NATRIURETIC PEPTIDE: B Natriuretic Peptide: 329.4 pg/mL — ABNORMAL HIGH (ref 0.0–100.0)

## 2015-09-26 MED ORDER — POTASSIUM CHLORIDE CRYS ER 20 MEQ PO TBCR
40.0000 meq | EXTENDED_RELEASE_TABLET | Freq: Once | ORAL | Status: AC
  Administered 2015-09-26: 40 meq via ORAL
  Filled 2015-09-26: qty 2

## 2015-09-26 MED ORDER — LORAZEPAM 2 MG/ML IJ SOLN: 0.5000 mg | Freq: Once | INTRAMUSCULAR | Status: DC

## 2015-09-26 MED ORDER — FUROSEMIDE 10 MG/ML IJ SOLN
40.0000 mg | Freq: Once | INTRAMUSCULAR | Status: AC
  Administered 2015-09-26: 40 mg via INTRAVENOUS
  Filled 2015-09-26: qty 4

## 2015-09-26 MED ORDER — LORAZEPAM 2 MG/ML IJ SOLN
1.0000 mg | Freq: Once | INTRAMUSCULAR | Status: AC
  Administered 2015-09-26: 1 mg via INTRAVENOUS
  Filled 2015-09-26: qty 1

## 2015-09-26 NOTE — ED Notes (Signed)
Report current blood pressure to EDP, no new orders received.

## 2015-09-26 NOTE — ED Notes (Signed)
Pt reports increased back pain related to increased congestion due to not being able to sleep

## 2015-09-26 NOTE — ED Notes (Signed)
Pt placed on 2 LNC due to sat being 92% and given 1 mg IV ativan for anxiety. .Marland Kitchen

## 2015-09-26 NOTE — ED Provider Notes (Signed)
CSN: [id number]     Arrival date & time 09/26/15  1834 History   First MD Initiated Contact with Patient 09/26/15 1957     Chief Complaint  Patient presents with  . Back Pain     (Consider location/radiation/quality/duration/timing/severity/associated sxs/prior Treatment) HPI   One week ago developed chest congestion and orthopnea. He has SOB if he tries to lay flat which improves with sitting up. He has also been coughing, dry with no sputum. Hx of Hypertension was started on blood pressure medication in March but did not follow-up as directed. No lower extremity swelling. No chest pains. Or exertional pains. No fever, no N/V/D, sore throat. He is having low back pain which he attributes to coughing.  PCP: Pcp Not In System - no PCP. PMH: hypertension  Edwin Lam is a 29 y.o.  male  ROS: The patient denies diaphoresis, fever, headache, weakness (general or focal), confusion, change of vision,  dysphagia, aphagia,abdominal pains, nausea, vomiting, diarrhea, lower extremity swelling, rash, neck pain, chest pain   Past Medical History  Diagnosis Date  . Hypertension    History reviewed. No pertinent past surgical history. History reviewed. No pertinent family history. Social History  Substance Use Topics  . Smoking status: Never Smoker   . Smokeless tobacco: None  . Alcohol Use: Yes    Review of Systems  All other systems reviewed and are negative.   Allergies  Review of patient's allergies indicates no known allergies.  Home Medications   Prior to Admission medications   Medication Sig Start Date End Date Taking? Authorizing Provider  ibuprofen (ADVIL,MOTRIN) 600 MG tablet Take 1 tablet (600 mg total) by mouth every 6 (six) hours as needed. 04/26/14   Tatyana Kirichenko, PA-C  ibuprofen (ADVIL,MOTRIN) 800 MG tablet Take 1 tablet (800 mg total) by mouth 3 (three) times daily. 07/24/14   Jennifer Piepenbrink, PA-C   BP 181/131 mmHg  Pulse 107  Temp(Src) 98.5 F (36.9  C) (Oral)  Resp 36  Ht  (1.778 m)  Wt 158.759 kg  BMI 50.22 kg/m2  SpO2 99% Physical Exam  Constitutional: He appears well-developed and well-nourished. No distress.  HENT:  Head: Normocephalic and atraumatic.  Eyes: Pupils are equal, round, and reactive to light.  Neck: Normal range of motion. Neck supple.  Cardiovascular: Normal rate and regular rhythm.   Pulmonary/Chest: Effort normal. Tachypnea noted. He has no decreased breath sounds. He has no wheezes. He has no rhonchi. He has no rales.  Mildly increased effort of breathing  Abdominal: Soft.  Musculoskeletal:  No LE swelling  Neurological: He is alert.  Skin: Skin is warm and dry.  Nursing note and vitals reviewed.   ED Course  Procedures (including critical care time) Labs Review Labs Reviewed  CBC WITH DIFFERENTIAL/PLATELET - Abnormal; Notable for the following:    MCV 76.5 (*)    MCH 23.5 (*)    All other components within normal limits  COMPREHENSIVE METABOLIC PANEL - Abnormal; Notable for the following:    Potassium 2.8 (*)    Glucose, Bld 111 (*)    Calcium 8.7 (*)    Total Bilirubin 1.3 (*)    All other components within normal limits  BRAIN NATRIURETIC PEPTIDE - Abnormal; Notable for the following:    B Natriuretic Peptide 329.4 (*)    All other components within normal limits    Imaging Review Dg Chest 2 View  09/26/2015  CLINICAL DATA:  Congestion and back pain.  Cough EXAM: CHEST - 2  VIEW COMPARISON:  Two-view chest x-ray 01/20/2015. FINDINGS: The heart is enlarged. Mild pulmonary vascular congestion is present. There are no significant effusions. No focal airspace consolidation is present. The visualized soft tissues and bony thorax are unremarkable. IMPRESSION: 1. Cardiomegaly and mild pulmonary vascular congestion suggesting early congestive heart failure. 2. No focal airspace opacification. Electronically Signed   By: Marin Robertshristopher  Mattern M.D.   On: 09/26/2015 20:44   I have personally  reviewed and evaluated these images and lab results as part of my medical decision-making.   EKG Interpretation None     Terrial RhodesMORTON, Kamauri [id number] 26-Sep-2015 20:56:54 [facility] ROUTINE RECORD Sinus tachycardia Possible Left atrial enlargement Borderline ECG 10225mm/s 5210mm/mV 100Hz  8.0 SP2 12SL 241 HD CID: [id number] Referred by: Unconfirmed Vent. rate 107 BPM PR interval 180 ms QRS duration 104 ms QT/QTc 376/501 ms P-R-T axes 59 38 50 MDM   Final diagnoses:  Acute congestive heart failure, unspecified congestive heart failure type [facility]HCC)  Essential hypertension    Patients chest xray shows cardiomegaly and early CHF. He is symptomatic and had to be placed on 02 for oxygen at 92 % on room air. He has a potassium of 2.8, cbc unremarkable and a BNP of 329.4.  I spoke with Dr. Antoine PocheHochrein with cardiology who declined admission. Dr. Julian ReilGardner has agreed for admission. [facility] by [facility].  Medications  furosemide (LASIX) injection 40 mg (40 mg Intravenous Given 09/26/15 2112)  LORazepam (ATIVAN) injection 1 mg (1 mg Intravenous Given 09/26/15 2112)  potassium chloride SA (K-DUR,KLOR-CON) CR tablet 40 mEq (40 mEq Oral Given 09/26/15 2152)   Patient informed of situation, need for admission and diagnosis. Dr. Clayborne DanaMesner has seen patient as well prior to transfer.  Filed Vitals:   09/26/15 2130 09/26/15 2249  BP: 163/105 181/131  Pulse: 112 107  Temp:    Resp: [address]36        Sirenity Shew, PA-C 09/26/15 2306  Marlon Peliffany Onnika Siebel, PA-C 09/26/15 2307  Marily MemosJason Mesner, MD 09/27/15 1558  Marily MemosJason Mesner, MD 09/27/15 1600

## 2015-09-26 NOTE — ED Notes (Signed)
Assumed care of patient from [place] RN. Pt resting quietly. No distress. No complaints voiced. Awaiting disposition.

## 2015-09-26 NOTE — ED Notes (Signed)
Patient transported to X-ray 

## 2015-09-27 ENCOUNTER — Encounter (HOSPITAL_BASED_OUTPATIENT_CLINIC_OR_DEPARTMENT_OTHER): Payer: Self-pay | Admitting: Internal Medicine

## 2015-09-27 DIAGNOSIS — I10 Essential (primary) hypertension: Secondary | ICD-10-CM | POA: Diagnosis present

## 2015-09-27 DIAGNOSIS — I5023 Acute on chronic systolic (congestive) heart failure: Secondary | ICD-10-CM | POA: Diagnosis present

## 2015-09-27 DIAGNOSIS — E876 Hypokalemia: Secondary | ICD-10-CM | POA: Diagnosis present

## 2015-09-27 DIAGNOSIS — Z9119 Patient's noncompliance with other medical treatment and regimen: Secondary | ICD-10-CM

## 2015-09-27 DIAGNOSIS — E669 Obesity, unspecified: Secondary | ICD-10-CM

## 2015-09-27 DIAGNOSIS — I161 Hypertensive emergency: Secondary | ICD-10-CM | POA: Diagnosis present

## 2015-09-27 DIAGNOSIS — Z91199 Patient's noncompliance with other medical treatment and regimen due to unspecified reason: Secondary | ICD-10-CM

## 2015-09-27 LAB — BASIC METABOLIC PANEL
ANION GAP: 8 (ref 5–15)
BUN: 11 mg/dL (ref 6–20)
CALCIUM: 9 mg/dL (ref 8.9–10.3)
CHLORIDE: 103 mmol/L (ref 101–111)
CO2: 30 mmol/L (ref 22–32)
Creatinine, Ser: 0.88 mg/dL (ref 0.61–1.24)
GFR calc Af Amer: >60 mL/min (ref >60)
GFR calc non Af Amer: >60 mL/min (ref >60)
GLUCOSE: 121 mg/dL — AB (ref 65–99)
Potassium: 3.1 mmol/L — ABNORMAL LOW (ref 3.5–5.1)
Sodium: 141 mmol/L (ref 135–145)

## 2015-09-27 LAB — TROPONIN I: Troponin I: <0.03 ng/mL (ref <0.031)

## 2015-09-27 LAB — LIPID PANEL
Cholesterol: 159 mg/dL (ref 0–200)
HDL: 23 mg/dL — AB (ref >40)
LDL CALC: 121 mg/dL — AB (ref 0–99)
Total CHOL/HDL Ratio: 6.9 RATIO
Triglycerides: 76 mg/dL (ref <150)
VLDL: 15 mg/dL (ref 0–40)

## 2015-09-27 LAB — PROTIME-INR
INR: 1.19 (ref 0.00–1.49)
PROTHROMBIN TIME: 15.3 s — AB (ref 11.6–15.2)

## 2015-09-27 LAB — MAGNESIUM: Magnesium: 2 mg/dL (ref 1.7–2.4)

## 2015-09-27 LAB — RAPID URINE DRUG SCREEN, HOSP PERFORMED
AMPHETAMINES: NOT DETECTED
Barbiturates: NOT DETECTED
Benzodiazepines: NOT DETECTED
Cocaine: NOT DETECTED
Opiates: NOT DETECTED
TETRAHYDROCANNABINOL: NOT DETECTED

## 2015-09-27 LAB — TSH: TSH: 2.237 u[IU]/mL (ref 0.350–4.500)

## 2015-09-27 LAB — APTT: aPTT: 30 seconds (ref 24–37)

## 2015-09-27 LAB — MRSA PCR SCREENING: MRSA by PCR: NEGATIVE

## 2015-09-27 MED ORDER — DEXTROSE 5 % IV SOLN
0.0000 ug/kg/min | INTRAVENOUS | Status: DC
  Administered 2015-09-27: 0.4 ug/kg/min via INTRAVENOUS
  Administered 2015-09-27 – 2015-09-28 (×2): 0.3 ug/kg/min via INTRAVENOUS
  Filled 2015-09-27 (×3): qty 2

## 2015-09-27 MED ORDER — IBUPROFEN 200 MG PO TABS: 600.0000 mg | ORAL_TABLET | Freq: Four times a day (QID) | ORAL | Status: DC | PRN

## 2015-09-27 MED ORDER — SODIUM CHLORIDE 0.9 % IJ SOLN: 3.0000 mL | INTRAMUSCULAR | Status: DC | PRN

## 2015-09-27 MED ORDER — ONDANSETRON HCL 4 MG/2ML IJ SOLN: 4.0000 mg | Freq: Three times a day (TID) | INTRAMUSCULAR | Status: AC | PRN

## 2015-09-27 MED ORDER — SODIUM CHLORIDE 0.9 % IJ SOLN
3.0000 mL | Freq: Two times a day (BID) | INTRAMUSCULAR | Status: DC
  Administered 2015-09-28 – 2015-10-01 (×6): 3 mL via INTRAVENOUS

## 2015-09-27 MED ORDER — CETYLPYRIDINIUM CHLORIDE 0.05 % MT LIQD
7.0000 mL | Freq: Two times a day (BID) | OROMUCOSAL | Status: DC
  Administered 2015-09-27 – 2015-10-01 (×9): 7 mL via OROMUCOSAL

## 2015-09-27 MED ORDER — HYDRALAZINE HCL 20 MG/ML IJ SOLN
10.0000 mg | Freq: Once | INTRAMUSCULAR | Status: AC
  Administered 2015-09-27: 10 mg via INTRAVENOUS
  Filled 2015-09-27: qty 1

## 2015-09-27 MED ORDER — ENOXAPARIN SODIUM 40 MG/0.4ML ~~LOC~~ SOLN: 40.0000 mg | SUBCUTANEOUS | Status: DC

## 2015-09-27 MED ORDER — LISINOPRIL 10 MG PO TABS
10.0000 mg | ORAL_TABLET | Freq: Every day | ORAL | Status: DC
  Administered 2015-09-27 – 2015-09-30 (×4): 10 mg via ORAL
  Filled 2015-09-27 (×6): qty 1

## 2015-09-27 MED ORDER — FUROSEMIDE 10 MG/ML IJ SOLN
40.0000 mg | Freq: Every day | INTRAMUSCULAR | Status: DC
  Administered 2015-09-27 – 2015-09-28 (×2): 40 mg via INTRAVENOUS
  Filled 2015-09-27 (×2): qty 4

## 2015-09-27 MED ORDER — NITROGLYCERIN IN D5W 200-5 MCG/ML-% IV SOLN
5.0000 ug/min | Freq: Once | INTRAVENOUS | Status: AC
Start: 2015-09-27 — End: 2015-09-27
  Administered 2015-09-27: 20 ug/min via INTRAVENOUS
  Filled 2015-09-27: qty 250

## 2015-09-27 MED ORDER — LABETALOL HCL 5 MG/ML IV SOLN
5.0000 mg | Freq: Once | INTRAVENOUS | Status: AC
  Administered 2015-09-27: 5 mg via INTRAVENOUS
  Filled 2015-09-27: qty 4

## 2015-09-27 MED ORDER — ASPIRIN EC 81 MG PO TBEC
81.0000 mg | DELAYED_RELEASE_TABLET | Freq: Every day | ORAL | Status: DC
  Administered 2015-09-27 – 2015-10-01 (×5): 81 mg via ORAL
  Filled 2015-09-27 (×5): qty 1

## 2015-09-27 MED ORDER — SODIUM CHLORIDE 0.9 % IV SOLN: 250.0000 mL | INTRAVENOUS | Status: DC | PRN

## 2015-09-27 MED ORDER — POTASSIUM CHLORIDE CRYS ER 20 MEQ PO TBCR
40.0000 meq | EXTENDED_RELEASE_TABLET | Freq: Once | ORAL | Status: AC
  Administered 2015-09-27: 40 meq via ORAL
  Filled 2015-09-27: qty 2

## 2015-09-27 MED ORDER — HYDRALAZINE HCL 20 MG/ML IJ SOLN
10.0000 mg | INTRAMUSCULAR | Status: AC
  Administered 2015-09-27: 10 mg via INTRAVENOUS
  Filled 2015-09-27: qty 1

## 2015-09-27 MED ORDER — ENOXAPARIN SODIUM 80 MG/0.8ML ~~LOC~~ SOLN
80.0000 mg | Freq: Every day | SUBCUTANEOUS | Status: DC
Start: 2015-09-27 — End: 2015-10-01
  Administered 2015-09-27 – 2015-10-01 (×5): 80 mg via SUBCUTANEOUS
  Filled 2015-09-27 (×5): qty 0.8

## 2015-09-27 MED ORDER — ACETAMINOPHEN 325 MG PO TABS
650.0000 mg | ORAL_TABLET | ORAL | Status: DC | PRN
  Administered 2015-09-27 – 2015-09-30 (×2): 650 mg via ORAL
  Filled 2015-09-27 (×2): qty 2

## 2015-09-27 NOTE — Progress Notes (Signed)
Utilization Review Completed.Edwin Lam T12/01/2015  

## 2015-09-27 NOTE — ED Provider Notes (Signed)
No beds available at [facility], the [facility], this was discussed with the hospitalist Dr. Julian ReilGardner as well as [facility], Dr. Clyde LundborgNiu, the patient has a blood pressure of 180/140, we'll start a nitro drip and hydralazine.  Edwin HongBrian Torah Pinnock, MD 09/27/15 [phone]

## 2015-09-27 NOTE — H&P (Signed)
[facility] History and Physical  Edwin RhodesLaroy Viger [id number] DOB: [birth year] DOA: 09/26/2015  Referring physician: ED physician PCP: Pcp Not In System  Specialists:   Chief Complaint: shortness of breath  HPI: Edwin RhodesLaroy Neaves is a 29 y.o. male with PMH of obesity, hypertension, not taking medications, who presents with shortness of breath.  Patient reports that he has been having shortness of breath and chest congestion for about one week. He has dry cough, but no chest pain. No fever or chills. He reports having mild lower back pain earlier today, which has resolved currently. He states that he has hypertension, but has not been taking medications for long time. He has mild leg edema. No abdominal pain, diarrhea, symptoms of UTI or unilateral weakness.  In ED, patient was found to have BNP 329.4, WBC 7.3, temperature normal, tachycardia, tachypnea, potassium 2.8, renal function okay, total bilirubin 5.3, liver is abnormal. Chest x-ray is consistent with congestive heart failure.  Where does patient live?   At home   Can patient participate in ADLs?  Yes   Review of Systems:   General: no fevers, chills, no changes in body weight, has fatigue HEENT: no blurry vision, hearing changes or sore throat Pulm: has dyspnea, coughing, no wheezing CV: no chest pain, palpitations Abd: no nausea, vomiting, abdominal pain, diarrhea, constipation GU: no dysuria, burning on urination, increased urinary frequency, hematuria  Ext: has leg edema Neuro: no unilateral weakness, numbness, or tingling, no vision change or hearing loss Skin: no rash MSK: No muscle spasm, no deformity, no limitation of range of movement in spin Heme: No easy bruising.  Travel history: No recent long distant travel.  Allergy: No Known Allergies  Past Medical History  Diagnosis Date  . Hypertension   . Obesity     History reviewed. No pertinent past surgical history.  Social History:  reports that he has never  smoked. He does not have any smokeless tobacco history on file. He reports that he drinks alcohol. He reports that he does not use illicit drugs.  Family History:  Family History  Problem Relation Age of Onset  . Hypertension Mother      Prior to Admission medications   Medication Sig Start Date End Date Taking? Authorizing Provider  ibuprofen (ADVIL,MOTRIN) 600 MG tablet Take 1 tablet (600 mg total) by mouth every 6 (six) hours as needed. 04/26/14   Tatyana Kirichenko, PA-C  ibuprofen (ADVIL,MOTRIN) 800 MG tablet Take 1 tablet (800 mg total) by mouth 3 (three) times daily. 07/24/14   Francee PiccoloJennifer Piepenbrink, PA-C    Physical Exam: Filed Vitals:   09/27/15 0530 09/27/15 0545 09/27/15 0600 09/27/15 0615  BP: 199/110 188/83 142/57 162/86  Pulse: 99 91 92 94  Temp:      TempSrc:      Resp: 34 16 17 24   Height:      Weight:      SpO2: 99% 96% 97% 99%   General: Not in acute distress. Morbid obesity HEENT:       Eyes: PERRL, EOMI, no scleral icterus.       ENT: No discharge from the ears and nose, no pharynx injection, no tonsillar enlargement.        Neck: difficulty to assess JVD due to obesity, no bruit, no mass felt. Heme: No neck lymph node enlargement. Cardiac: S1/S2, RRR, No murmurs, No gallops or rubs. Pulm:  No rales, wheezing, rhonchi or rubs. Abd: Soft, nondistended, nontender, no rebound pain, no organomegaly, BS present. Ext: 1+ pitting  leg edema bilaterally. 2+DP/PT pulse bilaterally. Musculoskeletal: No joint deformities, No joint redness or warmth, no limitation of ROM in spin. Skin: No rashes.  Neuro: Alert, oriented X3, cranial nerves II-XII grossly intact, muscle strength 5/5 in all extremities, sensation to light touch intact.  Psych: Patient is not psychotic, no suicidal or hemocidal ideation.  Labs on Admission:  Basic Metabolic Panel:  Recent Labs Lab 09/26/15 2103  NA 140  K 2.8*  CL 101  CO2 30  GLUCOSE 111*  BUN 15  CREATININE 1.11  CALCIUM 8.7*    Liver Function Tests:  Recent Labs Lab 09/26/15 2103  AST 23  ALT 26  ALKPHOS 52  BILITOT 1.3*  PROT 7.7  ALBUMIN 3.8   No results for input(s): LIPASE, AMYLASE in the last 168 hours. No results for input(s): AMMONIA in the last 168 hours. CBC:  Recent Labs Lab 09/26/15 2103  WBC 7.3  NEUTROABS 3.6  HGB 13.0  HCT 42.3  MCV 76.5*  PLT 339   Cardiac Enzymes: No results for input(s): CKTOTAL, CKMB, CKMBINDEX, TROPONINI in the last 168 hours.  BNP (last 3 results)  Recent Labs  09/26/15 2103  BNP 329.4*    ProBNP (last 3 results) No results for input(s): PROBNP in the last 8760 hours.  CBG: No results for input(s): GLUCAP in the last 168 hours.  Radiological Exams on Admission: Dg Chest 2 View  09/26/2015  CLINICAL DATA:  Congestion and back pain.  Cough EXAM: CHEST - 2 VIEW COMPARISON:  Two-view chest x-ray 01/20/2015. FINDINGS: The heart is enlarged. Mild pulmonary vascular congestion is present. There are no significant effusions. No focal airspace consolidation is present. The visualized soft tissues and bony thorax are unremarkable. IMPRESSION: 1. Cardiomegaly and mild pulmonary vascular congestion suggesting early congestive heart failure. 2. No focal airspace opacification. Electronically Signed   By: Marin Roberts M.D.   On: 09/26/2015 20:44    EKG: Independently reviewed. QTC 501, poor R-wave progression  Assessment/Plan Principal Problem:   Acute CHF (congestive heart failure) (HCC) Active Problems:   Hypertensive emergency   Obesity   Hypokalemia  Acute CHF (congestive heart failure) (HCC): patient's shortness of breath, elevated BNP, leg edema and congestive findings are consistent with acute CHF.  -will admit to SDU -Lasix 40 mg daily by IV -trop x 3 -2d echo -Risk factor stratification: A1c, FLP -start lisinopril 5 mg daily and ASA -Daily weights -strict I/O's -Low salt diet -No BB due to acute decompensation of  CHF -UDS  Hypertensive emergency: bp is evlevated at SBP 200-220 on admission. This is due to medication noncompliance. Initially patient was started with nitroglycerin drip, but he did not respond to treatment well. -Switched to nitroprusside IV -started lisinopril -on IV lasix.  Hypokalemia: K= 2.8 on admission. - Repleted - Check Mg level  DVT ppx:  SQ Lovenox  Code Status: Full code Family Communication:  Yes, patient's friend at bed side Disposition Plan: Admit to inpatient   Date of Service 09/27/2015    Lorretta Harp [facility] Pager [phone]  If 7PM-7AM, please contact night-coverage [email or web address] Password [facility] 09/27/2015, 6:29 AM

## 2015-09-27 NOTE — Progress Notes (Signed)
Progress Note   Edwin RhodesLaroy Blanford BJY:[id number] DOB: [birth year] DOA: 09/26/2015 PCP: Pcp Not In System   Brief Narrative:   Edwin Lam is an 29 y.o. male with a PMH of obesity and hypertension, noncompliant with medications, who was admitted 09/26/15 with congestive heart failure. No prior echocardiograms done.  Assessment/Plan:   Principal Problem:   Acute CHF (congestive heart failure) (HCC) - Chest x-ray done on admission showed cardiomegaly and pulmonary vascular congestion. - BNP was 329.4. Follow-up troponins. 2-D echocardiogram pending. Continue aspirin. - Placed on IV Lasix and a nitroprusside drip. - I/O balance almost 6.5 L. Large discrepancy in weight likely due to use of different scales, since weight up 24 pounds. - Lisinopril started.  Active Problems:   Hypertensive emergency in the setting of poorly controlled hypertension related to noncompliance - Systolic blood pressures 200-220 on admission. Now 150s-190s on a nitroprusside drip. - Continue lisinopril and Lasix.    Obesity, morbid, BMI 50 or higher (HCC) - Weight is 374 pounds with a BMI greater than 50. Will ask dietitian to counsel regarding weight loss. - Follow-up TSH. Check hemoglobin A1c and lipid panel.    Hypokalemia - Secondary to diuretics. Replete.    DVT Prophylaxis - Lovenox ordered.   Family Communication/Anticipated D/C date and plan/Code Status   Family Communication: Wife at bedside. Disposition Plan: Home when stable. Anticipated D/C date:   2-3 days depending on how Echo looks. Code Status: Full code.   IV Access:    Peripheral IV   Procedures and diagnostic studies:   Dg Chest 2 View  09/26/2015  CLINICAL DATA:  Congestion and back pain.  Cough EXAM: CHEST - 2 VIEW COMPARISON:  Two-view chest x-ray 01/20/2015. FINDINGS: The heart is enlarged. Mild pulmonary vascular congestion is present. There are no significant effusions. No focal airspace consolidation is present. The  visualized soft tissues and bony thorax are unremarkable. IMPRESSION: 1. Cardiomegaly and mild pulmonary vascular congestion suggesting early congestive heart failure. 2. No focal airspace opacification. Electronically Signed   By: Marin Robertshristopher  Mattern M.D.   On: 09/26/2015 20:44     Medical Consultants:    None.  Anti-Infectives:   Anti-infectives    None      Subjective:   Edwin RhodesLaroy Hilgert denies current dyspea, feels better.  No N/V.  No chest pain.  Objective:    Filed Vitals:   09/27/15 0630 09/27/15 0645 09/27/15 0700 09/27/15 0715  BP: 155/89 158/106 172/98 193/102  Pulse: 95 89 98 93  Temp:      TempSrc:      Resp: 8 14 28 29   Height:      Weight:      SpO2: 93% 96% 95% 96%    Intake/Output Summary (Last 24 hours) at 09/27/15 0734 Last data filed at 09/27/15 0716  Gross per 24 hour  Intake  12.55 ml  Output   6475 ml  Net -6462.45 ml   Filed Weights   09/26/15 1844 09/27/15 0420  Weight: 158.759 kg (350 lb) 170 kg (374 lb 12.5 oz)    Exam: Gen:  NAD Cardiovascular:  RRR, No M/R/G Respiratory:  Lungs diminished Gastrointestinal:  Abdomen soft, NT/ND, + BS Extremities:  1+ edema   Data Reviewed:    Labs: Basic Metabolic Panel:  Recent Labs Lab 09/26/15 2103  NA 140  K 2.8*  CL 101  CO2 30  GLUCOSE 111*  BUN 15  CREATININE 1.11  CALCIUM 8.7*   GFR Estimated Creatinine Clearance: 156.7  mL/min (by C-G formula based on Cr of 1.11). Liver Function Tests:  Recent Labs Lab 09/26/15 2103  AST 23  ALT 26  ALKPHOS 52  BILITOT 1.3*  PROT 7.7  ALBUMIN 3.8    CBC:  Recent Labs Lab 09/26/15 2103  WBC 7.3  NEUTROABS 3.6  HGB 13.0  HCT 42.3  MCV 76.5*  PLT 339   Cardiac Enzymes: No results for input(s): CKTOTAL, CKMB, CKMBINDEX, TROPONINI in the last 168 hours. BNP (last 3 results) No results for input(s): PROBNP in the last 8760 hours. CBG: No results for input(s): GLUCAP in the last 168 hours. D-Dimer: No results for  input(s): DDIMER in the last 72 hours. Hgb A1c: No results for input(s): HGBA1C in the last 72 hours. Lipid Profile: No results for input(s): CHOL, HDL, LDLCALC, TRIG, CHOLHDL, LDLDIRECT in the last 72 hours. Thyroid function studies: No results for input(s): TSH, T4TOTAL, T3FREE, THYROIDAB in the last 72 hours.  Invalid input(s): FREET3 Microbiology Recent Results (from the past 240 hour(s))  MRSA PCR Screening     Status: None   Collection Time: 09/27/15  4:34 AM  Result Value Ref Range Status   MRSA by PCR NEGATIVE NEGATIVE Final    Comment:        The GeneXpert MRSA Assay (FDA approved for NASAL specimens only), is one component of a comprehensive MRSA colonization surveillance program. It is not intended to diagnose MRSA infection nor to guide or monitor treatment for MRSA infections.      Medications:   . antiseptic oral rinse  7 mL Mouth Rinse BID  . aspirin EC  81 mg Oral Daily  . enoxaparin (LOVENOX) injection  40 mg Subcutaneous Q24H  . furosemide  40 mg Intravenous Daily  . lisinopril  10 mg Oral Daily  . sodium chloride  3 mL Intravenous Q12H   Continuous Infusions: . nitroPRUSSide 0.4 mcg/kg/min (09/27/15 0716)    Time spent: 25 minutes.   LOS: 0 days   Yuliana Vandrunen  [facility] Pager [phone]. If unable to reach me by pager, please call my cell phone at [phone].  *Please refer to [email or web address], password [email or web address] to get updated schedule on who will round on this patient, as hospitalists switch teams weekly. If 7PM-7AM, please contact night-coverage at [email or web address], password [email or web address] for any overnight needs.  09/27/2015, 7:34 AM

## 2015-09-28 ENCOUNTER — Inpatient Hospital Stay (HOSPITAL_COMMUNITY): Payer: PRIVATE HEALTH INSURANCE

## 2015-09-28 DIAGNOSIS — I161 Hypertensive emergency: Secondary | ICD-10-CM | POA: Diagnosis not present

## 2015-09-28 DIAGNOSIS — I10 Essential (primary) hypertension: Secondary | ICD-10-CM | POA: Diagnosis not present

## 2015-09-28 DIAGNOSIS — I5023 Acute on chronic systolic (congestive) heart failure: Secondary | ICD-10-CM

## 2015-09-28 DIAGNOSIS — I509 Heart failure, unspecified: Secondary | ICD-10-CM | POA: Diagnosis not present

## 2015-09-28 LAB — BASIC METABOLIC PANEL
ANION GAP: 9 (ref 5–15)
BUN: 12 mg/dL (ref 6–20)
CALCIUM: 8.8 mg/dL — AB (ref 8.9–10.3)
CO2: 29 mmol/L (ref 22–32)
Chloride: 103 mmol/L (ref 101–111)
Creatinine, Ser: 1.07 mg/dL (ref 0.61–1.24)
GFR calc Af Amer: >60 mL/min (ref >60)
Glucose, Bld: 123 mg/dL — ABNORMAL HIGH (ref 65–99)
POTASSIUM: 3.1 mmol/L — AB (ref 3.5–5.1)
SODIUM: 141 mmol/L (ref 135–145)

## 2015-09-28 LAB — HEMOGLOBIN A1C
Hgb A1c MFr Bld: 6.2 % — ABNORMAL HIGH (ref 4.8–5.6)
MEAN PLASMA GLUCOSE: 131 mg/dL

## 2015-09-28 MED ORDER — PERFLUTREN LIPID MICROSPHERE
INTRAVENOUS | Status: AC
  Filled 2015-09-28: qty 10

## 2015-09-28 MED ORDER — PERFLUTREN LIPID MICROSPHERE
1.0000 mL | INTRAVENOUS | Status: AC | PRN
  Administered 2015-09-28: 3 mL via INTRAVENOUS
  Filled 2015-09-28: qty 10

## 2015-09-28 MED ORDER — POTASSIUM CHLORIDE CRYS ER 20 MEQ PO TBCR
20.0000 meq | EXTENDED_RELEASE_TABLET | Freq: Three times a day (TID) | ORAL | Status: DC
  Administered 2015-09-28 (×3): 20 meq via ORAL
  Filled 2015-09-28 (×3): qty 1

## 2015-09-28 MED ORDER — HYDRALAZINE HCL 25 MG PO TABS
25.0000 mg | ORAL_TABLET | Freq: Three times a day (TID) | ORAL | Status: DC
  Administered 2015-09-28 (×2): 25 mg via ORAL
  Filled 2015-09-28 (×2): qty 1

## 2015-09-28 MED ORDER — FUROSEMIDE 10 MG/ML IJ SOLN
40.0000 mg | Freq: Two times a day (BID) | INTRAMUSCULAR | Status: DC
  Administered 2015-09-28 – 2015-09-30 (×4): 40 mg via INTRAVENOUS
  Filled 2015-09-28 (×4): qty 4

## 2015-09-28 MED ORDER — HYDRALAZINE HCL 50 MG PO TABS
50.0000 mg | ORAL_TABLET | Freq: Four times a day (QID) | ORAL | Status: DC
  Administered 2015-09-28 – 2015-09-30 (×8): 50 mg via ORAL
  Filled 2015-09-28 (×10): qty 1

## 2015-09-28 MED ORDER — CARVEDILOL 6.25 MG PO TABS
6.2500 mg | ORAL_TABLET | Freq: Two times a day (BID) | ORAL | Status: DC
  Administered 2015-09-28 – 2015-09-29 (×2): 6.25 mg via ORAL
  Filled 2015-09-28 (×2): qty 1

## 2015-09-28 MED ORDER — DEXTROSE 5 % IV SOLN
0.0000 ug/kg/min | INTRAVENOUS | Status: DC
  Filled 2015-09-28: qty 2

## 2015-09-28 NOTE — Progress Notes (Signed)
  Echocardiogram 2D Echocardiogram with Definity has been performed.  Nolon RodBrown, Tony 09/28/2015, 12:31 PM

## 2015-09-28 NOTE — Plan of Care (Signed)
Problem: Food- and Nutrition-Related Knowledge Deficit (NB-1.1) Goal: Nutrition education Formal process to instruct or train a patient/client in a skill or to impart knowledge to help patients/clients voluntarily manage or modify food choices and eating behavior to maintain or improve health. Outcome: Completed/Met Date Met:  09/28/15  RD consulted for nutrition education regarding weight loss.  Body mass index is 53.84 kg/(m^2). Pt meets criteria for morbid obesity based on current BMI.  RD provided "Weight Loss Tips" and "Low Sodium Nutrition Therapy" handouts from the Academy of [employer]etics. Patient was asleep and RD was unable to waken at this time. RD to attempt education at a later time.  Current diet order is 2 Gram sodium, patient is consuming approximately 100% of meals at this time. Labs and medications reviewed. No further nutrition interventions warranted at this time. If additional nutrition issues arise, please re-consult RD.  Clayton Bibles, MS, RD, LDN Pager: [phone] After Hours Pager: [phone]

## 2015-09-28 NOTE — Progress Notes (Addendum)
Progress Note   Edwin Lam RUE:[id number] DOB: 1986-06-27 DOA: 09/26/2015 PCP: Pcp Not In System   Brief Narrative:   Edwin Lam is an 29 y.o. male with a PMH of obesity and hypertension, noncompliant with medications, who was admitted 09/26/15 with congestive heart failure. No prior echocardiograms done.  Assessment/Plan:   Principal Problem:   Acute CHF (congestive heart failure) (HCC) - Chest x-ray done on admission showed cardiomegaly and pulmonary vascular congestion. - BNP was 329.4. Troponins negative. 2-D echocardiogram pending. Continue aspirin. - Continue IV Lasix, start hydralazine & beta blocker and wean nitroprusside drip. Continue lisinopril.  - I/O balance 1.8 L, down 8.3 L since admission. Weight unchanged over the past 24 hours. - Will get cardiology evaluation.  Active Problems:   Hypertensive emergency in the setting of poorly controlled hypertension related to noncompliance - Systolic blood pressures 200-220 on admission. Now 150s-190s on a nitroprusside drip. - Continue lisinopril and Lasix.  Add beta blocker & hydralazine.    Obesity, morbid, BMI 50 or higher (HCC) - Weight is 374 pounds with a BMI greater than 50. Will ask dietitian to counsel regarding weight loss. - TSH WNL. Follow-up hemoglobin A1c. - Cholesterol 159, LDL 121, HDL 76.    Hypokalemia - Secondary to diuretics. Increase potassium supplementation.    DVT Prophylaxis - Lovenox ordered.   Family Communication/Anticipated D/C date and plan/Code Status   Family Communication: Wife at bedside 09/27/15, no family present today. Disposition Plan: Home when stable. Anticipated D/C date:   2-3 days depending on how Echo looks. Code Status: Full code.   IV Access:    Peripheral IV   Procedures and diagnostic studies:   Dg Chest 2 View  09/26/2015  CLINICAL DATA:  Congestion and back pain.  Cough EXAM: CHEST - 2 VIEW COMPARISON:  Two-view chest x-ray 01/20/2015. FINDINGS: The  heart is enlarged. Mild pulmonary vascular congestion is present. There are no significant effusions. No focal airspace consolidation is present. The visualized soft tissues and bony thorax are unremarkable. IMPRESSION: 1. Cardiomegaly and mild pulmonary vascular congestion suggesting early congestive heart failure. 2. No focal airspace opacification. Electronically Signed   By: Marin Roberts M.D.   On: 09/26/2015 20:44     Medical Consultants:    Cardiology  Anti-Infectives:   Anti-infectives    None      Subjective:   Edwin Lam denies current dyspea, chest pain.  No N/V.  Appetite good.   Objective:    Filed Vitals:   09/28/15 0200 09/28/15 0300 09/28/15 0400 09/28/15 0500  BP: 153/97 134/82 154/76 155/82  Pulse: 102 96 95 98  Temp:   98.7 F (37.1 C)   TempSrc:      Resp: Height:      Weight:    170.2 kg (375 lb 3.6 oz)  SpO2: 92% 98% 91% 98%    Intake/Output Summary (Last 24 hours) at 09/28/15 0723 Last data filed at 09/28/15 0500  Gross per 24 hour  Intake 397.61 ml  Output   2250 ml  Net -1852.39 ml   Filed Weights   09/26/15 1844 09/27/15 0420 09/28/15 0500  Weight: 158.759 kg (350 lb) 170 kg (374 lb 12.5 oz) 170.2 kg (375 lb 3.6 oz)    Exam: Gen:  NAD Cardiovascular:  RRR, No M/R/G Respiratory:  Lungs diminished Gastrointestinal:  Abdomen soft, NT/ND, + BS Extremities:  1+ edema   Data Reviewed:    Labs: Basic Metabolic Panel:  Recent Labs Lab 09/26/15 2103 09/27/15 0820 09/28/15 0409  NA 140 141 141  K 2.8* 3.1* 3.1*  CL 101 103 103  CO2 30 30 29   GLUCOSE 111* 121* 123*  BUN 15 11 12   CREATININE 1.11 0.88 1.07  CALCIUM 8.7* 9.0 8.8*  MG  --  2.0  --    GFR Estimated Creatinine Clearance: 162.7 mL/min (by C-G formula based on Cr of 1.07). Liver Function Tests:  Recent Labs Lab 09/26/15 2103  AST 23  ALT 26  ALKPHOS 52  BILITOT 1.3*  PROT 7.7  ALBUMIN 3.8    CBC:  Recent Labs Lab  09/26/15 2103  WBC 7.3  NEUTROABS 3.6  HGB 13.0  HCT 42.3  MCV 76.5*  PLT 339   Cardiac Enzymes:  Recent Labs Lab 09/27/15 0820 09/27/15 1635  TROPONINI <0.03 <0.03   Hgb A1c: No results for input(s): HGBA1C in the last 72 hours. Lipid Profile:  Recent Labs  09/27/15 0820  CHOL 159  HDL 23*  LDLCALC 121*  TRIG 76  CHOLHDL 6.9   Thyroid function studies:  Recent Labs  09/27/15 0820  TSH 2.237   Microbiology Recent Results (from the past 240 hour(s))  MRSA PCR Screening     Status: None   Collection Time: 09/27/15  4:34 AM  Result Value Ref Range Status   MRSA by PCR NEGATIVE NEGATIVE Final    Comment:        The GeneXpert MRSA Assay (FDA approved for NASAL specimens only), is one component of a comprehensive MRSA colonization surveillance program. It is not intended to diagnose MRSA infection nor to guide or monitor treatment for MRSA infections.      Medications:   . antiseptic oral rinse  7 mL Mouth Rinse BID  . aspirin EC  81 mg Oral Daily  . enoxaparin (LOVENOX) injection  80 mg Subcutaneous Daily  . furosemide  40 mg Intravenous Daily  . lisinopril  10 mg Oral Daily  . sodium chloride  3 mL Intravenous Q12H   Continuous Infusions: . nitroPRUSSide 0.4 mcg/kg/min (09/27/15 1930)    Time spent: 25 minutes.   LOS: 1 day   Edwin Lam  [facility] Pager [phone]. If unable to reach me by pager, please call my cell phone at [phone].  *Please refer to [email or web address], password [email or web address] to get updated schedule on who will round on this patient, as hospitalists switch teams weekly. If 7PM-7AM, please contact night-coverage at [email or web address], password [email or web address] for any overnight needs.  09/28/2015, 7:23 AM

## 2015-09-28 NOTE — Consult Note (Signed)
Cardiologist:  New Reason for Consult:  Acute CHF Referring Physician: Rama  Edwin Lam is an 29 y.o. male.  HPI:   The patient is a 29 year old morbidly obese male with history of hypertension.  He does not smoke.  Drinks socially. Works at a [employer].  No exercise.  He was taking BP meds but stopped because he did not have a PCP.   He presented with a chief complaint of shortness of breath, orthopnea and LEE, both of which have improved.  He denies nausea, vomiting, fever, chest pain,  dizziness, PND, cough, congestion, abdominal pain, hematochezia, melena, lower extremity edema, claudication.  His father and MGM had heart disease.    Past Medical History  Diagnosis Date  . Hypertension   . Obesity     History reviewed. No pertinent past surgical history.  Family History  Problem Relation Age of Onset  . Hypertension Mother     Social History:  reports that he has never smoked. He does not have any smokeless tobacco history on file. He reports that he drinks alcohol. He reports that he does not use illicit drugs.  Allergies: No Known Allergies  Medications: Scheduled Meds: . antiseptic oral rinse  7 mL Mouth Rinse BID  . aspirin EC  81 mg Oral Daily  . carvedilol  6.25 mg Oral BID WC  . enoxaparin (LOVENOX) injection  80 mg Subcutaneous Daily  . furosemide  40 mg Intravenous Daily  . hydrALAZINE  25 mg Oral 3 times per day  . lisinopril  10 mg Oral Daily  . potassium chloride  20 mEq Oral TID  . sodium chloride  3 mL Intravenous Q12H   Continuous Infusions: . nitroPRUSSide 0.3 mcg/kg/min (09/28/15 1038)   PRN Meds:.sodium chloride, acetaminophen, ibuprofen, sodium chloride    Results for orders placed or performed during the hospital encounter of 09/26/15 (from the past 48 hour(s))  CBC with Differential/Platelet     Status: Abnormal   Collection Time: 09/26/15  9:03 PM  Result Value Ref Range   WBC 7.3 4.0 - 10.5 K/uL   RBC 5.53 4.22 - 5.81 MIL/uL   Hemoglobin 13.0 13.0 - 17.0 g/dL   HCT 42.3 39.0 - 52.0 %   MCV 76.5 (L) 78.0 - 100.0 fL   MCH 23.5 (L) 26.0 - 34.0 pg   MCHC 30.7 30.0 - 36.0 g/dL   RDW 15.1 11.5 - 15.5 %   Platelets 339 150 - 400 K/uL   Neutrophils Relative % 50 %   Neutro Abs 3.6 1.7 - 7.7 K/uL   Lymphocytes Relative 41 %   Lymphs Abs 3.0 0.7 - 4.0 K/uL   Monocytes Relative 7 %   Monocytes Absolute 0.5 0.1 - 1.0 K/uL   Eosinophils Relative 1 %   Eosinophils Absolute 0.1 0.0 - 0.7 K/uL   Basophils Relative 1 %   Basophils Absolute 0.1 0.0 - 0.1 K/uL  Comprehensive metabolic panel     Status: Abnormal   Collection Time: 09/26/15  9:03 PM  Result Value Ref Range   Sodium 140 135 - 145 mmol/L   Potassium 2.8 (L) 3.5 - 5.1 mmol/L   Chloride 101 101 - 111 mmol/L   CO2 30 22 - 32 mmol/L   Glucose, Bld 111 (H) 65 - 99 mg/dL   BUN 15 6 - 20 mg/dL   Creatinine, Ser 1.11 0.61 - 1.24 mg/dL   Calcium 8.7 (L) 8.9 - 10.3 mg/dL   Total Protein 7.7 6.5 - 8.1 g/dL  Albumin 3.8 3.5 - 5.0 g/dL   AST 23 15 - 41 U/L   ALT 26 17 - 63 U/L   Alkaline Phosphatase 52 38 - 126 U/L   Total Bilirubin 1.3 (H) 0.3 - 1.2 mg/dL   GFR calc non Af Amer >60 >60 mL/min   GFR calc Af Amer >60 >60 mL/min    Comment: (NOTE) The eGFR has been calculated using the CKD EPI equation. This calculation has not been validated in all clinical situations. eGFR's persistently <60 mL/min signify possible Chronic Kidney Disease.    Anion gap 9 5 - 15  Brain natriuretic peptide     Status: Abnormal   Collection Time: 09/26/15  9:03 PM  Result Value Ref Range   B Natriuretic Peptide 329.4 (H) 0.0 - 100.0 pg/mL  Urine rapid drug screen (hosp performed)     Status: None   Collection Time: 09/27/15  4:34 AM  Result Value Ref Range   Opiates NONE DETECTED NONE DETECTED   Cocaine NONE DETECTED NONE DETECTED   Benzodiazepines NONE DETECTED NONE DETECTED   Amphetamines NONE DETECTED NONE DETECTED   Tetrahydrocannabinol NONE DETECTED NONE DETECTED    Barbiturates NONE DETECTED NONE DETECTED    Comment:        DRUG SCREEN FOR MEDICAL PURPOSES ONLY.  IF CONFIRMATION IS NEEDED FOR ANY PURPOSE, NOTIFY LAB WITHIN 5 DAYS.        LOWEST DETECTABLE LIMITS FOR URINE DRUG SCREEN Drug Class       Cutoff (ng/mL) Amphetamine      1000 Barbiturate      200 Benzodiazepine   092 Tricyclics       330 Opiates          300 Cocaine          300 THC              50   MRSA PCR Screening     Status: None   Collection Time: 09/27/15  4:34 AM  Result Value Ref Range   MRSA by PCR NEGATIVE NEGATIVE    Comment:        The GeneXpert MRSA Assay (FDA approved for NASAL specimens only), is one component of a comprehensive MRSA colonization surveillance program. It is not intended to diagnose MRSA infection nor to guide or monitor treatment for MRSA infections.   Protime-INR     Status: Abnormal   Collection Time: 09/27/15  8:20 AM  Result Value Ref Range   Prothrombin Time 15.3 (H) 11.6 - 15.2 seconds   INR 1.19 0.00 - 1.49  APTT     Status: None   Collection Time: 09/27/15  8:20 AM  Result Value Ref Range   aPTT 30 24 - 37 seconds  Magnesium     Status: None   Collection Time: 09/27/15  8:20 AM  Result Value Ref Range   Magnesium 2.0 1.7 - 2.4 mg/dL  Troponin I     Status: None   Collection Time: 09/27/15  8:20 AM  Result Value Ref Range   Troponin I <0.03 <0.031 ng/mL    Comment:        NO INDICATION OF MYOCARDIAL INJURY.   TSH     Status: None   Collection Time: 09/27/15  8:20 AM  Result Value Ref Range   TSH 2.237 0.350 - 4.500 uIU/mL  Basic metabolic panel     Status: Abnormal   Collection Time: 09/27/15  8:20 AM  Result Value Ref Range   Sodium  141 135 - 145 mmol/L   Potassium 3.1 (L) 3.5 - 5.1 mmol/L   Chloride 103 101 - 111 mmol/L   CO2 30 22 - 32 mmol/L   Glucose, Bld 121 (H) 65 - 99 mg/dL   BUN 11 6 - 20 mg/dL   Creatinine, Ser 0.88 0.61 - 1.24 mg/dL   Calcium 9.0 8.9 - 10.3 mg/dL   GFR calc non Af Amer >60 >60  mL/min   GFR calc Af Amer >60 >60 mL/min    Comment: (NOTE) The eGFR has been calculated using the CKD EPI equation. This calculation has not been validated in all clinical situations. eGFR's persistently <60 mL/min signify possible Chronic Kidney Disease.    Anion gap 8 5 - 15  Lipid panel     Status: Abnormal   Collection Time: 09/27/15  8:20 AM  Result Value Ref Range   Cholesterol 159 0 - 200 mg/dL   Triglycerides 76 <150 mg/dL   HDL 23 (L) >40 mg/dL   Total CHOL/HDL Ratio 6.9 RATIO   VLDL 15 0 - 40 mg/dL   LDL Cholesterol 121 (H) 0 - 99 mg/dL    Comment:        Total Cholesterol/HDL:CHD Risk Coronary Heart Disease Risk Table                     Men   Women  1/2 Average Risk   3.4   3.3  Average Risk       5.0   4.4  2 X Average Risk   9.6   7.1  3 X Average Risk  23.4   11.0        Use the calculated Patient Ratio above and the CHD Risk Table to determine the patient's CHD Risk.        ATP III CLASSIFICATION (LDL):  <100     mg/dL   Optimal  100-129  mg/dL   Near or Above                    Optimal  130-159  mg/dL   Borderline  160-189  mg/dL   High  >190     mg/dL   Very High Performed at [facility]   Troponin I     Status: None   Collection Time: 09/27/15  4:35 PM  Result Value Ref Range   Troponin I <0.03 <0.031 ng/mL    Comment:        NO INDICATION OF MYOCARDIAL INJURY.   Basic metabolic panel     Status: Abnormal   Collection Time: 09/28/15  4:09 AM  Result Value Ref Range   Sodium 141 135 - 145 mmol/L   Potassium 3.1 (L) 3.5 - 5.1 mmol/L   Chloride 103 101 - 111 mmol/L   CO2 29 22 - 32 mmol/L   Glucose, Bld 123 (H) 65 - 99 mg/dL   BUN 12 6 - 20 mg/dL   Creatinine, Ser 1.07 0.61 - 1.24 mg/dL   Calcium 8.8 (L) 8.9 - 10.3 mg/dL   GFR calc non Af Amer >60 >60 mL/min   GFR calc Af Amer >60 >60 mL/min    Comment: (NOTE) The eGFR has been calculated using the CKD EPI equation. This calculation has not been validated in all clinical  situations. eGFR's persistently <60 mL/min signify possible Chronic Kidney Disease.    Anion gap 9 5 - 15    Dg Chest 2 View  09/26/2015  CLINICAL  DATA:  Congestion and back pain.  Cough EXAM: CHEST - 2 VIEW COMPARISON:  Two-view chest x-ray 01/20/2015. FINDINGS: The heart is enlarged. Mild pulmonary vascular congestion is present. There are no significant effusions. No focal airspace consolidation is present. The visualized soft tissues and bony thorax are unremarkable. IMPRESSION: 1. Cardiomegaly and mild pulmonary vascular congestion suggesting early congestive heart failure. 2. No focal airspace opacification. Electronically Signed   By: San Morelle M.D.   On: 09/26/2015 20:44    Review of Systems  Constitutional: Negative for fever and diaphoresis.  HENT: Negative for congestion.   Respiratory: Positive for shortness of breath. Negative for cough.   Cardiovascular: Positive for orthopnea, leg swelling and PND. Negative for chest pain.  Gastrointestinal: Negative for nausea, vomiting, abdominal pain, blood in stool and melena.  Musculoskeletal: Negative for myalgias.  Neurological: Negative for dizziness and weakness.  All other systems reviewed and are negative.  Blood pressure 163/90, pulse 93, temperature 97.7 F (36.5 C), temperature source Oral, resp. rate 21, height 5' 10"  (1.778 m), weight 375 lb 3.6 oz (170.2 kg), SpO2 95 %. Physical Exam  Nursing note and vitals reviewed. Constitutional: He is oriented to person, place, and time. He appears well-developed. No distress.  Morbidly obese  HENT:  Head: Normocephalic and atraumatic.  Eyes: EOM are normal. Pupils are equal, round, and reactive to light. No scleral icterus.  Neck: Normal range of motion. Neck supple. JVD present.  Cardiovascular: Normal rate, regular rhythm, S1 normal and S2 normal.   No murmur heard. Pulses:      Radial pulses are 2+ on the right side, and 2+ on the left side.       Dorsalis pedis  pulses are 2+ on the right side, and 2+ on the left side.  Respiratory: Effort normal and breath sounds normal. He has no wheezes. He has no rales.  GI: Soft. Bowel sounds are normal. He exhibits no distension. There is no tenderness.  Musculoskeletal: He exhibits no edema.  Neurological: He is alert and oriented to person, place, and time. He exhibits normal muscle tone.  Skin: Skin is warm and dry.  Acanthosis Nigrans on neck  Psychiatric: He has a normal mood and affect.    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  LVH on EKG.  Net fluids:  -2.3L/-8.3L.  JVD elevated but he does not look grossly overloaded at the moment.  Continue IV lasix.  Will review echo once resulted.  Consider OP stress testing.  Watch SCr.   Hypertensive emergency   Hypokalemia  Replacement ordered.     H/O noncompliance with medical treatment, presenting hazards to health   Obesity, morbid, BMI 50 or higher ([place])  We discussed referral to a Registered Dietician.  I gave him the contact information.    Essential hypertension Still not well controlled.  Coreg was added today.  Continue to monitor.  Also on hydralazine 25 Q8, lisinopril 10.      Apnea on tele:  He will need a sleep study as an OP.     Tarri Fuller, [place] 09/28/2015, 12:08 PM

## 2015-09-29 DIAGNOSIS — I10 Essential (primary) hypertension: Secondary | ICD-10-CM | POA: Diagnosis not present

## 2015-09-29 DIAGNOSIS — E876 Hypokalemia: Secondary | ICD-10-CM

## 2015-09-29 DIAGNOSIS — R7303 Prediabetes: Secondary | ICD-10-CM

## 2015-09-29 DIAGNOSIS — I5023 Acute on chronic systolic (congestive) heart failure: Secondary | ICD-10-CM | POA: Diagnosis not present

## 2015-09-29 DIAGNOSIS — I161 Hypertensive emergency: Secondary | ICD-10-CM | POA: Diagnosis not present

## 2015-09-29 DIAGNOSIS — I5041 Acute combined systolic (congestive) and diastolic (congestive) heart failure: Secondary | ICD-10-CM

## 2015-09-29 LAB — BRAIN NATRIURETIC PEPTIDE: B NATRIURETIC PEPTIDE 5: 82.7 pg/mL (ref 0.0–100.0)

## 2015-09-29 LAB — BASIC METABOLIC PANEL
ANION GAP: 9 (ref 5–15)
BUN: 11 mg/dL (ref 6–20)
CHLORIDE: 103 mmol/L (ref 101–111)
CO2: 27 mmol/L (ref 22–32)
Calcium: 8.8 mg/dL — ABNORMAL LOW (ref 8.9–10.3)
Creatinine, Ser: 0.98 mg/dL (ref 0.61–1.24)
Glucose, Bld: 122 mg/dL — ABNORMAL HIGH (ref 65–99)
POTASSIUM: 3.3 mmol/L — AB (ref 3.5–5.1)
SODIUM: 139 mmol/L (ref 135–145)

## 2015-09-29 MED ORDER — CARVEDILOL 12.5 MG PO TABS
12.5000 mg | ORAL_TABLET | Freq: Two times a day (BID) | ORAL | Status: DC
  Administered 2015-09-29 – 2015-10-01 (×4): 12.5 mg via ORAL
  Filled 2015-09-29 (×4): qty 1

## 2015-09-29 MED ORDER — POTASSIUM CHLORIDE CRYS ER 20 MEQ PO TBCR
40.0000 meq | EXTENDED_RELEASE_TABLET | Freq: Two times a day (BID) | ORAL | Status: AC
  Administered 2015-09-29 (×2): 40 meq via ORAL
  Filled 2015-09-29 (×2): qty 2

## 2015-09-29 MED ORDER — SPIRONOLACTONE 25 MG PO TABS
25.0000 mg | ORAL_TABLET | Freq: Every day | ORAL | Status: DC
  Administered 2015-09-29 – 2015-10-01 (×3): 25 mg via ORAL
  Filled 2015-09-29 (×3): qty 1

## 2015-09-29 NOTE — Progress Notes (Signed)
Progress Note   Edwin Lam EAV:[id number] DOB: 1986-06-05 DOA: 09/26/2015 PCP: Pcp Not In System   Brief Narrative:   Edwin Lam is an 29 y.o. male with a PMH of obesity and hypertension, noncompliant with medications, who was admitted 09/26/15 with congestive heart failure and hypertensive emergency requiring a nitroprusside infusion and management in the ICU. No prior echocardiograms done. 2-D echocardiogram here shows grade 1 diastolic dysfunction and an EF of 20-25 percent with diffuse hypokinesis. Cardiology consulted for assistance with management and to facilitate outpatient follow-up.  Assessment/Plan:   Principal Problem:   Acute systolic and diastolic CHF (congestive heart failure) (HCC) - Chest x-ray done on admission showed cardiomegaly and pulmonary vascular congestion. - BNP was 329.4. Troponins negative. Continue aspirin. - 2-D echo with EF 20-25 percent, diffuse hypokinesis and grade 1 diastolic dysfunction. - Continue IV Lasix, lisinopril, hydralazine & beta blocker. Nitroprusside drip weaned.  Increase Lisinopril.  - I/O balance 1.8 L, down 10 L since admission. Weight decreased over the past 24 hours. - Cardiology following.  Active Problems:   Hypertensive emergency in the setting of poorly controlled hypertension related to noncompliance - Systolic blood pressures 200-220 on admission. Now 140s-170s. - Continue Lasix, lisinopril, hydralazine, and Coreg. Continue to adjust BP medications to achieve BP control.    Obesity, morbid, BMI 50 or higher (HCC)/prediabetes/dyslipidemia - Weight is 374 pounds with a BMI greater than 50. Counseled by dietitian 09/28/15. - TSH WNL. Hemoglobin A1c 6.2%, consistent with prediabetes. - Cholesterol 159, LDL 121, HDL 76.    Hypokalemia - Secondary to diuretics. Increase potassium supplementation.    DVT Prophylaxis - Lovenox ordered.   Family Communication/Anticipated D/C date and plan/Code Status   Family  Communication: Wife at bedside. Disposition Plan: Home when stable. Anticipated D/C date:   1-2 days depending on attaining BP control. Code Status: Full code.   IV Access:    Peripheral IV   Procedures and diagnostic studies:   Dg Chest 2 View  09/26/2015  CLINICAL DATA:  Congestion and back pain.  Cough EXAM: CHEST - 2 VIEW COMPARISON:  Two-view chest x-ray 01/20/2015. FINDINGS: The heart is enlarged. Mild pulmonary vascular congestion is present. There are no significant effusions. No focal airspace consolidation is present. The visualized soft tissues and bony thorax are unremarkable. IMPRESSION: 1. Cardiomegaly and mild pulmonary vascular congestion suggesting early congestive heart failure. 2. No focal airspace opacification. Electronically Signed   By: Marin Robertshristopher  Mattern M.D.   On: 09/26/2015 20:44     Medical Consultants:    Cardiology: Lyn RecordsHenry W Smith, MD  Anti-Infectives:   Anti-infectives    None      Subjective:   Edwin Lam continues to deny current dyspea, chest pain.  No N/V.  Appetite good.   Objective:    Filed Vitals:   09/29/15 0400 09/29/15 0500 09/29/15 0530 09/29/15 0600  BP: 147/102 145/120 170/123 177/124  Pulse: 94 100 98 95  Temp:  98.3 F (36.8 C)    TempSrc:  Oral    Resp: 18 17 32 30  Height:      Weight:    168.7 kg (371 lb 14.7 oz)  SpO2: 98% 90% 95% 96%    Intake/Output Summary (Last 24 hours) at 09/29/15 0727 Last data filed at 09/29/15 0600  Gross per 24 hour  Intake   1350 ml  Output   3227 ml  Net  -1877 ml   Filed Weights   09/27/15 0420 09/28/15 0500 09/29/15 0600  Weight: 170 kg (374 lb 12.5 oz) 170.2 kg (375 lb 3.6 oz) 168.7 kg (371 lb 14.7 oz)    Exam: Gen:  NAD Cardiovascular:  RRR, No M/R/G Respiratory:  Lungs diminished Gastrointestinal:  Abdomen soft, NT/ND, + BS Extremities:  1+ edema   Data Reviewed:    Labs: Basic Metabolic Panel:  Recent Labs Lab 09/26/15 2103 09/27/15 0820  09/28/15 0409 09/29/15 0336  NA 140 141 141 139  K 2.8* 3.1* 3.1* 3.3*  CL 101 103 103 103  CO2 GLUCOSE 111* 121* 123* 122*  BUN CREATININE 1.11 0.88 1.07 0.98  CALCIUM 8.7* 9.0 8.8* 8.8*  MG  --  2.0  --   --    GFR Estimated Creatinine Clearance: 176.7 mL/min (by C-G formula based on Cr of 0.98). Liver Function Tests:  Recent Labs Lab 09/26/15 2103  AST 23  ALT 26  ALKPHOS 52  BILITOT 1.3*  PROT 7.7  ALBUMIN 3.8    CBC:  Recent Labs Lab 09/26/15 2103  WBC 7.3  NEUTROABS 3.6  HGB 13.0  HCT 42.3  MCV 76.5*  PLT 339   Cardiac Enzymes:  Recent Labs Lab 09/27/15 0820 09/27/15 1635  TROPONINI <0.03 <0.03   Hgb A1c:  Recent Labs  09/27/15 0820  HGBA1C 6.2*   Lipid Profile:  Recent Labs  09/27/15 0820  CHOL 159  HDL 23*  LDLCALC 121*  TRIG 76  CHOLHDL 6.9   Thyroid function studies:  Recent Labs  09/27/15 0820  TSH 2.237   Microbiology Recent Results (from the past 240 hour(s))  MRSA PCR Screening     Status: None   Collection Time: 09/27/15  4:34 AM  Result Value Ref Range Status   MRSA by PCR NEGATIVE NEGATIVE Final    Comment:        The GeneXpert MRSA Assay (FDA approved for NASAL specimens only), is one component of a comprehensive MRSA colonization surveillance program. It is not intended to diagnose MRSA infection nor to guide or monitor treatment for MRSA infections.      Medications:   . antiseptic oral rinse  7 mL Mouth Rinse BID  . aspirin EC  81 mg Oral Daily  . carvedilol  6.25 mg Oral BID WC  . enoxaparin (LOVENOX) injection  80 mg Subcutaneous Daily  . furosemide  40 mg Intravenous Q12H  . hydrALAZINE  50 mg Oral 4 times per day  . lisinopril  10 mg Oral Daily  . potassium chloride  20 mEq Oral TID  . sodium chloride  3 mL Intravenous Q12H   Continuous Infusions: . nitroPRUSSide 0.2 mcg/kg/min (09/28/15 1723)    Time spent: 25 minutes.   LOS: 2 days    RAMA,CHRISTINA  [facility] Pager [phone]. If unable to reach me by pager, please call my cell phone at [phone].  *Please refer to [email or web address], password [email or web address] to get updated schedule on who will round on this patient, as hospitalists switch teams weekly. If 7PM-7AM, please contact night-coverage at [email or web address], password [email or web address] for any overnight needs.  09/29/2015, 7:27 AM

## 2015-09-29 NOTE — Progress Notes (Signed)
    Subjective: No orthopnea,   Objective: Vital signs in last 24 hours: Temp:  [97.3 F (36.3 C)-98.3 F (36.8 C)] 97.8 F (36.6 C) (12/06 0736) Pulse Rate:  [32-103] 95 (12/06 0600) Resp:  [14-38] 30 (12/06 0600) BP: (117-216)/(62-132) 177/124 mmHg (12/06 0600) SpO2:  [90 %-99 %] 96 % (12/06 0600) Weight:  [371 lb 14.7 oz (168.7 kg)] 371 lb 14.7 oz (168.7 kg) (12/06 0600) Last BM Date:  (pta)  Intake/Output from previous day: 12/05 0701 - 12/06 0700 In: 1350 [P.O.:1040; I.V.:310] Out: 3227 [Urine:3225; Stool:2] Intake/Output this shift:    Medications Scheduled Meds: . antiseptic oral rinse  7 mL Mouth Rinse BID  . aspirin EC  81 mg Oral Daily  . carvedilol  6.25 mg Oral BID WC  . enoxaparin (LOVENOX) injection  80 mg Subcutaneous Daily  . furosemide  40 mg Intravenous Q12H  . hydrALAZINE  50 mg Oral 4 times per day  . lisinopril  10 mg Oral Daily  . potassium chloride  40 mEq Oral BID  . sodium chloride  3 mL Intravenous Q12H   Continuous Infusions: . nitroPRUSSide 0.2 mcg/kg/min (09/28/15 1723)   PRN Meds:.sodium chloride, acetaminophen, sodium chloride  PE: General appearance: alert, cooperative and no distress Neck: JVD looks elevated still Lungs: clear to auscultation bilaterally Heart: regular rate and rhythm, S1, S2 normal, no murmur, click, rub or gallop Extremities: Trace LEE Pulses: 2+ and symmetric Skin: Warm and dry Neurologic: Grossly normal  Lab Results:   Recent Labs  09/26/15 2103  WBC 7.3  HGB 13.0  HCT 42.3  PLT 339   BMET  Recent Labs  09/27/15 0820 09/28/15 0409 09/29/15 0336  NA 141 141 139  K 3.1* 3.1* 3.3*  CL 103 103 103  CO2 30 29 27   GLUCOSE 121* 123* 122*  BUN 11 12 11   CREATININE 0.88 1.07 0.98  CALCIUM 9.0 8.8* 8.8*   PT/INR  Recent Labs  09/27/15 0820  LABPROT 15.3*  INR 1.19   Cholesterol  Recent Labs  09/27/15 0820  CHOL 159   Lipid Panel     Component Value Date/Time   CHOL 159  09/27/2015 0820   TRIG 76 09/27/2015 0820   HDL 23* 09/27/2015 0820   CHOLHDL 6.9 09/27/2015 0820   VLDL 15 09/27/2015 0820   LDLCALC 121* 09/27/2015 0820     Assessment/Plan   Principal Problem:   Acute CHF (congestive heart failure) (HCC) Net fluids:  -1.9L/-10.2.  As expected EF is unfortunately low, 20-25%. Diffuse hypokinesis, G1DD, LA moderate dilation.  SCr stable and WNL Continue IV lasix today.   ACE, hydralazine, Coreg.   Increase lisinopril to 20 today.  Daily wt monitoring and low sodium diet discussed again.    Hypertensive emergency  Some improvement in BP  Hydralazine 50 tid(imcreased yesterday), coreg 6.25 bid, lisinopril 10,    Hypokalemia  Improving 3.3   Uncontrolled hypertension   H/O noncompliance with medical treatment, presenting hazards to health   Obesity, morbid, BMI 50 or higher (HCC)   Prediabetes   Sleep apnea:  Need sleep study  I think he can transfer to tele floor if IM agrees.      LOS: 2 days    Deavin Forst PA-C 09/29/2015 7:48 AM

## 2015-09-30 DIAGNOSIS — I5023 Acute on chronic systolic (congestive) heart failure: Secondary | ICD-10-CM | POA: Diagnosis not present

## 2015-09-30 DIAGNOSIS — E876 Hypokalemia: Secondary | ICD-10-CM | POA: Diagnosis not present

## 2015-09-30 DIAGNOSIS — I509 Heart failure, unspecified: Secondary | ICD-10-CM | POA: Insufficient documentation

## 2015-09-30 DIAGNOSIS — I10 Essential (primary) hypertension: Secondary | ICD-10-CM | POA: Diagnosis not present

## 2015-09-30 DIAGNOSIS — I161 Hypertensive emergency: Secondary | ICD-10-CM | POA: Diagnosis not present

## 2015-09-30 LAB — BASIC METABOLIC PANEL
Anion gap: 7 (ref 5–15)
BUN: 15 mg/dL (ref 6–20)
CHLORIDE: 104 mmol/L (ref 101–111)
CO2: 29 mmol/L (ref 22–32)
Calcium: 9 mg/dL (ref 8.9–10.3)
Creatinine, Ser: 1.16 mg/dL (ref 0.61–1.24)
GFR calc non Af Amer: >60 mL/min (ref >60)
Glucose, Bld: 118 mg/dL — ABNORMAL HIGH (ref 65–99)
POTASSIUM: 4.1 mmol/L (ref 3.5–5.1)
SODIUM: 140 mmol/L (ref 135–145)

## 2015-09-30 MED ORDER — FUROSEMIDE 40 MG PO TABS
40.0000 mg | ORAL_TABLET | Freq: Every day | ORAL | Status: DC
  Administered 2015-10-01: 40 mg via ORAL
  Filled 2015-09-30: qty 1

## 2015-09-30 MED ORDER — POTASSIUM CHLORIDE CRYS ER 20 MEQ PO TBCR
20.0000 meq | EXTENDED_RELEASE_TABLET | Freq: Two times a day (BID) | ORAL | Status: DC
  Administered 2015-09-30 – 2015-10-01 (×2): 20 meq via ORAL
  Filled 2015-09-30 (×2): qty 1

## 2015-09-30 MED ORDER — HYDRALAZINE HCL 50 MG PO TABS
100.0000 mg | ORAL_TABLET | Freq: Two times a day (BID) | ORAL | Status: DC
  Administered 2015-09-30 – 2015-10-01 (×2): 100 mg via ORAL
  Filled 2015-09-30 (×3): qty 2

## 2015-09-30 NOTE — Progress Notes (Signed)
Patient Profile: 29 y/o morbidly obese male with h/o poorly controlled HTN and medication noncompliance, admitted for symptomatic acute combined systolic + diastolic CHF with EF of 20-25%. Suspected hypertensive heart disease.   Subjective: Feels better. No complaints. Denies dyspnea. No chest pain. Ambulated today w/o difficulty/ limitations.   Objective: Vital signs in last 24 hours: Temp:  [97.7 F (36.5 C)-98.4 F (36.9 C)] 98.4 F (36.9 C) (12/07 0450) Pulse Rate:  [86-102] 88 (12/07 0450) Resp:  [17-26] 20 (12/07 0450) BP: (129-172)/(67-119) 139/84 mmHg (12/07 0450) SpO2:  [93 %-100 %] 98 % (12/07 0450) Weight:  [375 lb 12.8 oz (170.462 kg)] 375 lb 12.8 oz (170.462 kg) (12/07 0548) Last BM Date: 09/29/15  Intake/Output from previous day: 12/06 0701 - 12/07 0700 In: 257.6 [P.O.:240; I.V.:17.6] Out: 1505 [Urine:1505] Intake/Output this shift:    Medications Current Facility-Administered Medications  Medication Dose Route Frequency Provider Last Rate Last Dose  . 0.9 %  sodium chloride infusion  250 mL Intravenous PRN Lorretta Harp, MD      . acetaminophen (TYLENOL) tablet 650 mg  650 mg Oral Q4H PRN Lorretta Harp, MD   650 mg at 09/27/15 [phone]  . antiseptic oral rinse (CPC / CETYLPYRIDINIUM CHLORIDE 0.05%) solution 7 mL  7 mL Mouth Rinse BID Maryruth Bun Rama, MD   7 mL at 09/29/15 2126  . aspirin EC tablet 81 mg  81 mg Oral Daily Lorretta Harp, MD   81 mg at 09/29/15 9604  . carvedilol (COREG) tablet 12.5 mg  12.5 mg Oral BID WC Lyn Records, MD   12.5 mg at 09/29/15 1715  . enoxaparin (LOVENOX) injection 80 mg  80 mg Subcutaneous Daily Maryruth Bun Rama, MD   80 mg at 09/29/15 0923  . furosemide (LASIX) injection 40 mg  40 mg Intravenous Q12H Lyn Records, MD   40 mg at 09/29/15 2008  . hydrALAZINE (APRESOLINE) tablet 50 mg  50 mg Oral 4 times per day Lyn Records, MD   50 mg at 09/30/15 0604  . lisinopril (PRINIVIL,ZESTRIL) tablet 10 mg  10 mg Oral Daily Lorretta Harp, MD   10 mg at  09/29/15 5409  . sodium chloride 0.9 % injection 3 mL  3 mL Intravenous Q12H Lorretta Harp, MD   3 mL at 09/29/15 2126  . sodium chloride 0.9 % injection 3 mL  3 mL Intravenous PRN Lorretta Harp, MD      . spironolactone (ALDACTONE) tablet 25 mg  25 mg Oral Daily Lyn Records, MD   25 mg at 09/29/15 1531    PE: General appearance: alert, cooperative, no distress and morbidly obese Neck: no carotid bruit and no JVD Lungs: clear to auscultation bilaterally Heart: regular rate and rhythm, S1, S2 normal, no murmur, click, rub or gallop Extremities: no LEE Pulses: 2+ and symmetric Skin: warm and dry Neurologic: Grossly normal  Lab Results:  No results for input(s): WBC, HGB, HCT, PLT in the last 72 hours. BMET  Recent Labs  09/28/15 0409 09/29/15 0336 09/30/15 0525  NA 141 139 140  K 3.1* 3.3* 4.1  CL 103 103 104  CO2 GLUCOSE 123* 122* 118*  BUN CREATININE 1.07 0.98 1.16  CALCIUM 8.8* 8.8* 9.0   PT/INR  Recent Labs  09/27/15 0820  LABPROT 15.3*  INR 1.19   Cholesterol  Recent Labs  09/27/15 0820  CHOL 159    Studies/Results: 2D Echo 09/28/15 Study Conclusions  -  Left ventricle: The cavity size was moderately dilated. Wall thickness was normal. Systolic function was severely reduced. The estimated ejection fraction was in the range of 20% to 25%. Diffuse hypokinesis. Doppler parameters are consistent with abnormal left ventricular relaxation (grade 1 diastolic dysfunction). - Aortic valve: There was no stenosis. - Mitral valve: There was no significant regurgitation. - Left atrium: The atrium was moderately dilated. - Right ventricle: Poorly visualized. Systolic function appears at least mildly decreased. - Right atrium: Poorly visualized. - Pulmonary arteries: No complete TR doppler jet so unable to estimate PA systolic pressure.  Impressions:  - Technically difficult study with poor acoustic windows. Moderately dilated LV  with diffuse hypokinesis, EF 20-25%. The RV was poorly visualized but appeared at least mildly hypokinetic. No significant valvular abnormalities noted.   Assessment/Plan  Principal Problem:   Acute on chronic systolic heart failure (HCC) Active Problems:   Hypertensive emergency   Hypokalemia   Uncontrolled hypertension   H/O noncompliance with medical treatment, presenting hazards to health   Obesity, morbid, BMI 50 or higher (HCC)   Essential hypertension   Prediabetes   1. Acute on Chronic Combined Systolic + Diastolic CHF: LV systolic function severely impaired with EF of 20-25% and diffuse hypokinesis. Suspect hypertensive heart disease. BP is now well controlled. He is diuesing well. -11.4L total since admit. Renal function and K both stable. No arrhthymias on telemetry. Continue HF meds including Coreg, Lisinopril, spironolactone, hydralazine and lasix.    2. Essential Hypertension: BP well controlled this am at 139/84. Continue Coreg, lisinopril, hydralazine, Lasix and spironolactone.     LOS: 3 days    Seanmichael Salmons M. Sharol HarnessSimmons, PA-C 09/30/2015 8:14 AM

## 2015-09-30 NOTE — Progress Notes (Signed)
[facility] PROGRESS NOTE  Edwin Lam YNW:[id number] DOB: 1986/01/21 DOA: 09/26/2015 PCP: Pcp Not In System  HPI/Brief narrative 29 y.o. male with a PMH of obesity and hypertension, noncompliant with medications, who was admitted 09/26/15 with congestive heart failure and hypertensive emergency requiring a nitroprusside infusion and management in the ICU. No prior echocardiograms done. 2-D echocardiogram here shows grade 1 diastolic dysfunction and an EF of 20-25 percent with diffuse hypokinesis. Cardiology consulted for assistance with management and to facilitate outpatient follow-up.  Assessment/Plan:  Acute systolic and diastolic CHF (congestive heart failure) (HCC) - Chest x-ray done on admission showed cardiomegaly and pulmonary vascular congestion. - BNP was 329.4. Troponins negative. Continue aspirin. - 2-D echo with EF 20-25 percent, diffuse hypokinesis and grade 1 diastolic dysfunction. - Continue IV Lasix, lisinopril, hydralazine & beta blocker. Nitroprusside drip weaned.  - I/O balance net neg 11L since admission. Daily weights seem labile - question accuracy of weights. - Cardiology following. Possible d/c in AM if stable   Hypertensive emergency in the setting of poorly controlled hypertension related to noncompliance - Systolic blood pressures 200-220 on admission. Now 140s-170s. - Continue Lasix, lisinopril, hydralazine, and Coreg. Continue to adjust BP medications to achieve BP control. - BP improved   Obesity, morbid, BMI 50 or higher (HCC)/prediabetes/dyslipidemia - Weight is 374 pounds with a BMI greater than 50. Counseled by dietitian 09/28/15. - TSH WNL. Hemoglobin A1c 6.2%, consistent with prediabetes. - Cholesterol 159, LDL 121, HDL 76.   Hypokalemia - Secondary to diuretics. Increase potassium supplementation. - Normalized   DVT Prophylaxis - Lovenox ordered.  Code Status: Full Family Communication: Pt in room Disposition Plan: Possible d/c home  in AM   Consultants:  Cardiology  Procedures:    Antibiotics: Anti-infectives    None      HPI/Subjective: No complaints today  Objective: Filed Vitals:   09/30/15 0450 09/30/15 0548 09/30/15 0903 09/30/15 1323  BP: 139/84  135/76 112/57  Pulse: 88  89 92  Temp: 98.4 F (36.9 C)   98 F (36.7 C)  TempSrc: Oral   Oral  Resp: 20   18  Height:      Weight:  170.462 kg (375 lb 12.8 oz)    SpO2: 98%   99%    Intake/Output Summary (Last 24 hours) at 09/30/15 1710 Last data filed at 09/30/15 1300  Gross per 24 hour  Intake    600 ml  Output   1825 ml  Net  -1225 ml   Filed Weights   09/28/15 0500 09/29/15 0600 09/30/15 0548  Weight: 170.2 kg (375 lb 3.6 oz) 168.7 kg (371 lb 14.7 oz) 170.462 kg (375 lb 12.8 oz)    Exam:   General:  Awake, in nad  Cardiovascular: regular, s1, s2  Respiratory: normal resp effort, no wheezing  Abdomen: soft, obese, nondistended  Musculoskeletal: perfused, no clubbing   Data Reviewed: Basic Metabolic Panel:  Recent Labs Lab 09/26/15 2103 09/27/15 0820 09/28/15 0409 09/29/15 0336 09/30/15 0525  NA 140 141 141 139 140  Lam 2.8* 3.1* 3.1* 3.3* 4.1  CL 101 103 103 103 104  CO2 30 30 29 27 29   GLUCOSE 111* 121* 123* 122* 118*  BUN 15 11 12 11 15   CREATININE 1.11 0.88 1.07 0.98 1.16  CALCIUM 8.7* 9.0 8.8* 8.8* 9.0  MG  --  2.0  --   --   --    Liver Function Tests:  Recent Labs Lab 09/26/15 2103  AST 23  ALT 26  ALKPHOS 52  BILITOT 1.3*  PROT 7.7  ALBUMIN 3.8   No results for input(s): LIPASE, AMYLASE in the last 168 hours. No results for input(s): AMMONIA in the last 168 hours. CBC:  Recent Labs Lab 09/26/15 2103  WBC 7.3  NEUTROABS 3.6  HGB 13.0  HCT 42.3  MCV 76.5*  PLT 339   Cardiac Enzymes:  Recent Labs Lab 09/27/15 0820 09/27/15 1635  TROPONINI <0.03 <0.03   BNP (last 3 results)  Recent Labs  09/26/15 2103 09/29/15 0336  BNP 329.4* 82.7    ProBNP (last 3 results) No results  for input(s): PROBNP in the last 8760 hours.  CBG: No results for input(s): GLUCAP in the last 168 hours.  Recent Results (from the past 240 hour(s))  MRSA PCR Screening     Status: None   Collection Time: 09/27/15  4:34 AM  Result Value Ref Range Status   MRSA by PCR NEGATIVE NEGATIVE Final    Comment:        The GeneXpert MRSA Assay (FDA approved for NASAL specimens only), is one component of a comprehensive MRSA colonization surveillance program. It is not intended to diagnose MRSA infection nor to guide or monitor treatment for MRSA infections.      Studies: No results found.  Scheduled Meds: . antiseptic oral rinse  7 mL Mouth Rinse BID  . aspirin EC  81 mg Oral Daily  . carvedilol  12.5 mg Oral BID WC  . enoxaparin (LOVENOX) injection  80 mg Subcutaneous Daily  . [START ON 10/01/2015] furosemide  40 mg Oral Daily  . hydrALAZINE  100 mg Oral BID AC  . lisinopril  10 mg Oral Daily  . potassium chloride  20 mEq Oral BID  . sodium chloride  3 mL Intravenous Q12H  . spironolactone  25 mg Oral Daily   Continuous Infusions:   Principal Problem:   Acute on chronic systolic heart failure (HCC) Active Problems:   Hypertensive emergency   Hypokalemia   Uncontrolled hypertension   H/O noncompliance with medical treatment, presenting hazards to health   Obesity, morbid, BMI 50 or higher (HCC)   Essential hypertension   Prediabetes    Edwin Lam  [facility] Pager [phone]. If 7PM-7AM, please contact night-coverage at [email or web address], password [facility] 09/30/2015, 5:10 PM  LOS: 3 days

## 2015-09-30 NOTE — Progress Notes (Signed)
This CM met with pt to discuss follow up care after DC. Pt plans to move in with family in [place] after he leaves the hospital. Pt states that he plans to continue care with his Cardiologist in [place] after he moves. Pt does not have insurance or a PCP in [place]. This CM provided pt with list of free clinics in [place]. I also expressed importance of follow up care with his heart failure. Pt agrees to follow up as instructed. No other CM needs identified. Marney Doctor RN,BSN,NCM [phone]

## 2015-10-01 DIAGNOSIS — I5023 Acute on chronic systolic (congestive) heart failure: Secondary | ICD-10-CM | POA: Diagnosis not present

## 2015-10-01 DIAGNOSIS — I10 Essential (primary) hypertension: Secondary | ICD-10-CM | POA: Diagnosis not present

## 2015-10-01 DIAGNOSIS — I161 Hypertensive emergency: Secondary | ICD-10-CM | POA: Diagnosis not present

## 2015-10-01 LAB — BASIC METABOLIC PANEL
ANION GAP: 7 (ref 5–15)
BUN: 17 mg/dL (ref 6–20)
CHLORIDE: 104 mmol/L (ref 101–111)
CO2: 27 mmol/L (ref 22–32)
Calcium: 8.9 mg/dL (ref 8.9–10.3)
Creatinine, Ser: 1.06 mg/dL (ref 0.61–1.24)
GFR calc non Af Amer: >60 mL/min (ref >60)
Glucose, Bld: 111 mg/dL — ABNORMAL HIGH (ref 65–99)
Potassium: 4.2 mmol/L (ref 3.5–5.1)
Sodium: 138 mmol/L (ref 135–145)

## 2015-10-01 LAB — GLUCOSE, CAPILLARY: Glucose-Capillary: 180 mg/dL — ABNORMAL HIGH (ref 65–99)

## 2015-10-01 MED ORDER — HYDRALAZINE HCL 50 MG PO TABS: 50.0000 mg | ORAL_TABLET | Freq: Two times a day (BID) | ORAL | Status: AC

## 2015-10-01 MED ORDER — POTASSIUM CHLORIDE CRYS ER 20 MEQ PO TBCR: 20.0000 meq | EXTENDED_RELEASE_TABLET | Freq: Two times a day (BID) | ORAL | Status: AC

## 2015-10-01 MED ORDER — SPIRONOLACTONE 25 MG PO TABS: 25.0000 mg | ORAL_TABLET | Freq: Every day | ORAL | Status: AC

## 2015-10-01 MED ORDER — CARVEDILOL 12.5 MG PO TABS: 12.5000 mg | ORAL_TABLET | Freq: Two times a day (BID) | ORAL | Status: AC

## 2015-10-01 MED ORDER — FUROSEMIDE 40 MG PO TABS: 40.0000 mg | ORAL_TABLET | Freq: Every day | ORAL | Status: AC

## 2015-10-01 MED ORDER — VITAMINS A & D EX OINT: TOPICAL_OINTMENT | CUTANEOUS | Status: DC | PRN

## 2015-10-01 MED ORDER — ASPIRIN 81 MG PO TBEC: 81.0000 mg | DELAYED_RELEASE_TABLET | Freq: Every day | ORAL | Status: AC

## 2015-10-01 NOTE — Progress Notes (Signed)
Education provided regarding CHF. DC instruction reviewed,questions, concerns denied. Pt is ambulatory and a&ox4

## 2015-10-01 NOTE — Progress Notes (Signed)
   No difficulty with blood pressure earlier this morning.  Agree with discontinuation of ACE inhibitor for the time being, and resume his outpatient.   Will need early basic metabolic panel 3-5 days post discharge.  If able to ambulate without dizziness okay to discharge today.  If persistent low blood pressure decrease hydralazine to 50 mg twice a day

## 2015-10-01 NOTE — Progress Notes (Signed)
Patient Profile: 29 y/o morbidly obese male with h/o poorly controlled HTN and medication noncompliance, admitted for symptomatic acute combined systolic + diastolic CHF with EF of 20-25%. Suspected hypertensive heart disease.   Subjective: No complaints. Resting comfortably. Denies any dyspnea.   Objective: Vital signs in last 24 hours: Temp:  [97.6 F (36.4 C)-98.1 F (36.7 C)] 98.1 F (36.7 C) (12/08 0550) Pulse Rate:  [85-104] 104 (12/08 0550) Resp:  [18-22] 22 (12/08 0550) BP: (112-137)/(57-90) 137/90 mmHg (12/08 0550) SpO2:  [99 %-100 %] 100 % (12/08 0550) Weight:  [378 lb 9.6 oz (171.732 kg)] 378 lb 9.6 oz (171.732 kg) (12/08 0550) Last BM Date: 09/30/15  Intake/Output from previous day: 12/07 0701 - 12/08 0700 In: 600 [P.O.:600] Out: 1400 [Urine:1400] Intake/Output this shift:    Medications Current Facility-Administered Medications  Medication Dose Route Frequency Provider Last Rate Last Dose  . 0.9 %  sodium chloride infusion  250 mL Intravenous PRN Lorretta HarpXilin Niu, MD      . acetaminophen (TYLENOL) tablet 650 mg  650 mg Oral Q4H PRN Lorretta HarpXilin Niu, MD   650 mg at 09/30/15 1522  . antiseptic oral rinse (CPC / CETYLPYRIDINIUM CHLORIDE 0.05%) solution 7 mL  7 mL Mouth Rinse BID Maryruth Bunhristina P Rama, MD   7 mL at 09/30/15 2112  . aspirin EC tablet 81 mg  81 mg Oral Daily Lorretta HarpXilin Niu, MD   81 mg at 09/30/15 [id number]  . carvedilol (COREG) tablet 12.5 mg  12.5 mg Oral BID WC Lyn RecordsHenry W Smith, MD   12.5 mg at 09/30/15 1707  . enoxaparin (LOVENOX) injection 80 mg  80 mg Subcutaneous Daily Maryruth Bunhristina P Rama, MD   80 mg at 09/30/15 0904  . furosemide (LASIX) tablet 40 mg  40 mg Oral Daily Lyn RecordsHenry W Smith, MD      . hydrALAZINE (APRESOLINE) tablet 100 mg  100 mg Oral BID AC Lyn RecordsHenry W Smith, MD   100 mg at 09/30/15 1707  . lisinopril (PRINIVIL,ZESTRIL) tablet 10 mg  10 mg Oral Daily Lorretta HarpXilin Niu, MD   10 mg at 09/30/15 0904  . potassium chloride SA (K-DUR,KLOR-CON) CR tablet 20 mEq  20 mEq Oral BID Lyn RecordsHenry  W Smith, MD   20 mEq at 09/30/15 2112  . sodium chloride 0.9 % injection 3 mL  3 mL Intravenous Q12H Lorretta HarpXilin Niu, MD   3 mL at 09/30/15 2114  . sodium chloride 0.9 % injection 3 mL  3 mL Intravenous PRN Lorretta HarpXilin Niu, MD      . spironolactone (ALDACTONE) tablet 25 mg  25 mg Oral Daily Lyn RecordsHenry W Smith, MD   25 mg at 09/30/15 [id number]    PE: General appearance: alert, cooperative, no distress and morbidly obese Neck: no carotid bruit and no JVD Lungs: clear to auscultation bilaterally Heart: regular rhythm, tachy rate S1, S2 normal, no murmur, click, rub or gallop Extremities: no LEE Pulses: 2+ and symmetric Skin: warm and dry Neurologic: Grossly normal  Lab Results:  No results for input(s): WBC, HGB, HCT, PLT in the last 72 hours. BMET  Recent Labs  09/29/15 0336 09/30/15 0525 10/01/15 0420  NA 139 140 138  K 3.3* 4.1 4.2  CL 103 104 104  CO2 27 29 27   GLUCOSE 122* 118* 111*  BUN 11 15 17   CREATININE 0.98 1.16 1.06  CALCIUM 8.8* 9.0 8.9    Studies/Results: 2D Echo 09/28/15 Study Conclusions  - Left ventricle: The cavity size was moderately dilated. Wall thickness was normal. Systolic  function was severely reduced. The estimated ejection fraction was in the range of 20% to 25%. Diffuse hypokinesis. Doppler parameters are consistent with abnormal left ventricular relaxation (grade 1 diastolic dysfunction). - Aortic valve: There was no stenosis. - Mitral valve: There was no significant regurgitation. - Left atrium: The atrium was moderately dilated. - Right ventricle: Poorly visualized. Systolic function appears at least mildly decreased. - Right atrium: Poorly visualized. - Pulmonary arteries: No complete TR doppler jet so unable to estimate PA systolic pressure.  Impressions:  - Technically difficult study with poor acoustic windows. Moderately dilated LV with diffuse hypokinesis, EF 20-25%. The RV was poorly visualized but appeared at least mildly  hypokinetic. No significant valvular abnormalities noted.   Assessment/Plan  Principal Problem:   Acute on chronic systolic heart failure (HCC) Active Problems:   Hypertensive emergency   Hypokalemia   Uncontrolled hypertension   H/O noncompliance with medical treatment, presenting hazards to health   Obesity, morbid, BMI 50 or higher (HCC)   Essential hypertension   Prediabetes   Acute congestive heart failure (HCC)  1. Acute on Chronic Combined Systolic + Diastolic CHF: LV systolic function severely impaired with EF of 20-25% and diffuse hypokinesis. Suspect hypertensive heart disease. BP is now well controlled. He is diuesing well. -12.2L total since admit. Converted to PO Lasix yesterday, 40 mg daily. Renal function and K both stable. No arrhthymias on telemetry, but slight tachycardic, sinus. Rate in the low 100s resting. Continue HF meds including Coreg, Lisinopril, spironolactone, hydralazine and lasix. Further increase Coreg to 25 mg BID for HR.   2. Essential Hypertension: BP well controlled this am at 137/90. Continue Coreg, lisinopril, hydralazine, Lasix and spironolactone.      LOS: 4 days    Ruth Kovich M. Sharol Harness, PA-C 10/01/2015 7:45 AM

## 2015-10-01 NOTE — Progress Notes (Signed)
Nutrition Education Note  RD consulted for diet education. Previously unable to talk with pt; talked with pt this afternoon and focused on low-sodium diet given hx of poorly controlled HTN.   RD provided "Low Sodium Nutrition Therapy," "Lower Sodium Foods List," and "Sodium-Free Flavoring Tips" handouts from the Academy of [employer]. Reviewed patient's dietary recall. Provided examples on ways to decrease sodium intake in diet. Discouraged intake of processed foods and use of salt shaker. Encouraged fresh fruits and vegetables as well as whole grain sources of carbohydrates to maximize fiber intake.   Pt reports that he has been doing some research online using his cell phone concerning a low sodium diet. He asks questions throughout concerning appropriate foods, use of frozen foods, and incorporation of a shake such as Slim Fast for breakfast as he often does not feel hungry for this meal.  RD discussed why it is important for patient to adhere to diet recommendations, and emphasized the role of fluids, foods to avoid, and importance of weighing self daily. Talked with pt about focusing on why diet adherence is important as it relates to his overall health. Teach back method used.  Expect good to fair compliance.  Body mass index is 54.32 kg/(m^2). Pt meets criteria for morbidly obese based on current BMI.  Current diet order is 2 gram Na, patient is consuming approximately 100% of meals at this time. Labs and medications reviewed. No further nutrition interventions warranted at this time. RD contact information provided. If additional nutrition issues arise, please re-consult RD.      Trenton GammonJessica Hussien Greenblatt, RD, LDN Inpatient Clinical Dietitian Pager # [phone] After hours/weekend pager # [phone]

## 2015-10-01 NOTE — Progress Notes (Signed)
Patient complains of dizziness  While standing. Bp assessed 100/48, 88,18, 100 %ra cbg 189. Education provided regarding BP medication that were given prior. BP/Sx reported to provider. Order received to hold Lisinopril. Will continue to monitor

## 2015-10-01 NOTE — Discharge Summary (Addendum)
Physician Discharge Summary  Edwin Lam XBJ:[id number] DOB: 1985/12/03 DOA: 09/26/2015  PCP: Pcp Not In System  Admit date: 09/26/2015 Discharge date: 10/01/2015  Time spent: 20 minutes  Recommendations for Outpatient Follow-up:  1. Establish and follow up with PCP (list of PCP's given to patient by Case Management) 2. Follow up with Cardiology as scheduled 3. Repeat bmet in 3-5 days   Discharge Diagnoses:  Principal Problem:   Acute on chronic systolic heart failure (HCC) Active Problems:   Hypertensive emergency   Hypokalemia   Uncontrolled hypertension   H/O noncompliance with medical treatment, presenting hazards to health   Obesity, morbid, BMI 50 or higher (HCC)   Essential hypertension   Prediabetes   Acute congestive heart failure [facility])   Discharge Condition: Improved  Diet recommendation: 2gm sodium restricted diet  Filed Weights   09/29/15 0600 09/30/15 0548 10/01/15 0550  Weight: 168.7 kg (371 lb 14.7 oz) 170.462 kg (375 lb 12.8 oz) 171.732 kg (378 lb 9.6 oz)    History of present illness:  Please review dictated H and P from 12/3 for details. Briefly, 29 y.o. male with a PMH of obesity and hypertension, noncompliant with medications, who was admitted 09/26/15 with congestive heart failure and hypertensive emergency requiring a nitroprusside infusion and management in the ICU. No prior echocardiograms done. 2-D echocardiogram here shows grade 1 diastolic dysfunction and an EF of 20-25 percent with diffuse hypokinesis. Cardiology consulted for assistance with management and to facilitate outpatient follow-up.  Hospital Course:   Acute on chronic systolic CHF (congestive heart failure) (HCC) - Chest x-ray was done on admission and showed cardiomegaly and pulmonary vascular congestion. - Presenting BNP was 329.4. Troponins negative. Continued aspirin. - 2-D echo with EF 20-25 percent, diffuse hypokinesis and grade 1 diastolic dysfunction. - Continue IV Lasix,  lisinopril, hydralazine & beta blocker. Nitroprusside drip weaned.  - I/O balance: net neg 12.2L since admission. Daily weights seem labile - question accuracy of weights. - Cardiology following. Cardiology recs to cont meds listed below and to gradually add ACEI as BP tolerates as outpatient   Hypertensive emergency in the setting of poorly controlled hypertension related to noncompliance - Systolic blood pressures 200-220 on admission. Now normotensive. - Continued Lasix, hydralazine, and Coreg. Continue to adjust BP medications to achieve BP control. - BP much improved - Cardiology recs for starting ACEI as outpatient   Obesity, morbid, BMI 50 or higher (HCC)/prediabetes/dyslipidemia - Weight is 374 pounds with a BMI greater than 50. Counseled by dietitian 09/28/15. - TSH WNL. Hemoglobin A1c 6.2%, consistent with prediabetes. - Cholesterol 159, LDL 121, HDL 76.   Hypokalemia - Secondary to diuretics. Increase potassium supplementation. - Normalized   DVT Prophylaxis - Lovenox ordered while inpatient.  Consultations:  Cardiology  Discharge Exam: Filed Vitals:   10/01/15 0550 10/01/15 0900 10/01/15 1000 10/01/15 1344  BP: 137/90 148/89 100/48 105/91  Pulse: 104 92  95  Temp: 98.1 F (36.7 C)   98.3 F (36.8 C)  TempSrc: Oral   Oral  Resp: 22   19  Height:      Weight: 171.732 kg (378 lb 9.6 oz)     SpO2: 100%   99%    General: Awake, in nad Cardiovascular: regular, s1, s2 Respiratory: normal resp effort, no wheezing  Discharge Instructions     Medication List    TAKE these medications        AIRBORNE GUMMIES Chew  Chew 1 tablet by mouth 3 (three) times daily.  aspirin 81 MG EC tablet  Take 1 tablet (81 mg total) by mouth daily.     carvedilol 12.5 MG tablet  Commonly known as:  COREG  Take 1 tablet (12.5 mg total) by mouth 2 (two) times daily with a meal.     furosemide 40 MG tablet  Commonly known as:  LASIX  Take 1 tablet (40 mg total) by  mouth daily.     guaiFENesin 600 MG 12 hr tablet  Commonly known as:  MUCINEX  Take 600 mg by mouth 2 (two) times daily as needed.     hydrALAZINE 50 MG tablet  Commonly known as:  APRESOLINE  Take 1 tablet (50 mg total) by mouth 2 (two) times daily with a meal.     potassium chloride SA 20 MEQ tablet  Commonly known as:  K-DUR,KLOR-CON  Take 1 tablet (20 mEq total) by mouth 2 (two) times daily.     spironolactone 25 MG tablet  Commonly known as:  ALDACTONE  Take 1 tablet (25 mg total) by mouth daily.       No Known Allergies Follow-up Information    Follow up with Follow up with PCP in 1-2 weeks.   Why:  Hospital follow up      Follow up with Lesleigh Noe, MD.   Specialty:  Cardiology   Why:  Hospital follow up   Contact information:   [address] [place] [address] [phone]        The results of significant diagnostics from this hospitalization (including imaging, microbiology, ancillary and laboratory) are listed below for reference.    Significant Diagnostic Studies: Dg Chest 2 View  09/26/2015  CLINICAL DATA:  Congestion and back pain.  Cough EXAM: CHEST - 2 VIEW COMPARISON:  Two-view chest x-ray 01/20/2015. FINDINGS: The heart is enlarged. Mild pulmonary vascular congestion is present. There are no significant effusions. No focal airspace consolidation is present. The visualized soft tissues and bony thorax are unremarkable. IMPRESSION: 1. Cardiomegaly and mild pulmonary vascular congestion suggesting early congestive heart failure. 2. No focal airspace opacification. Electronically Signed   By: Marin Roberts M.D.   On: 09/26/2015 20:44    Microbiology: Recent Results (from the past 240 hour(s))  MRSA PCR Screening     Status: None   Collection Time: 09/27/15  4:34 AM  Result Value Ref Range Status   MRSA by PCR NEGATIVE NEGATIVE Final    Comment:        The GeneXpert MRSA Assay (FDA approved for NASAL specimens only),  is one component of a comprehensive MRSA colonization surveillance program. It is not intended to diagnose MRSA infection nor to guide or monitor treatment for MRSA infections.      Labs: Basic Metabolic Panel:  Recent Labs Lab 09/27/15 0820 09/28/15 0409 09/29/15 0336 09/30/15 0525 10/01/15 0420  NA 141 141 139 140 138  K 3.1* 3.1* 3.3* 4.1 4.2  CL 103 103 103 104 104  CO2 GLUCOSE 121* 123* 122* 118* 111*  BUN CREATININE 0.88 1.07 0.98 1.16 1.06  CALCIUM 9.0 8.8* 8.8* 9.0 8.9  MG 2.0  --   --   --   --    Liver Function Tests:  Recent Labs Lab 09/26/15 2103  AST 23  ALT 26  ALKPHOS 52  BILITOT 1.3*  PROT 7.7  ALBUMIN 3.8   No results for input(s): LIPASE, AMYLASE in the last  168 hours. No results for input(s): AMMONIA in the last 168 hours. CBC:  Recent Labs Lab 09/26/15 2103  WBC 7.3  NEUTROABS 3.6  HGB 13.0  HCT 42.3  MCV 76.5*  PLT 339   Cardiac Enzymes:  Recent Labs Lab 09/27/15 0820 09/27/15 1635  TROPONINI <0.03 <0.03   BNP: BNP (last 3 results)  Recent Labs  09/26/15 2103 09/29/15 0336  BNP 329.4* 82.7    ProBNP (last 3 results) No results for input(s): PROBNP in the last 8760 hours.  CBG:  Recent Labs Lab 10/01/15 1033  GLUCAP 180*     Signed:  CHIU, STEPHEN K  [facility] 10/01/2015, 5:16 PM

## 2015-10-01 NOTE — Discharge Instructions (Signed)
Please have PCP repeat basic metabolic profile within 3-5 days after discharge

## 2016-08-13 IMAGING — CR DG CHEST 2V
2 series · 2 of 2 positions shown · non-contrast
Comparison: 10/25/2012

CLINICAL DATA: Cough and chest congestion.

EXAM:
CHEST  2 VIEW

[w chest pa]
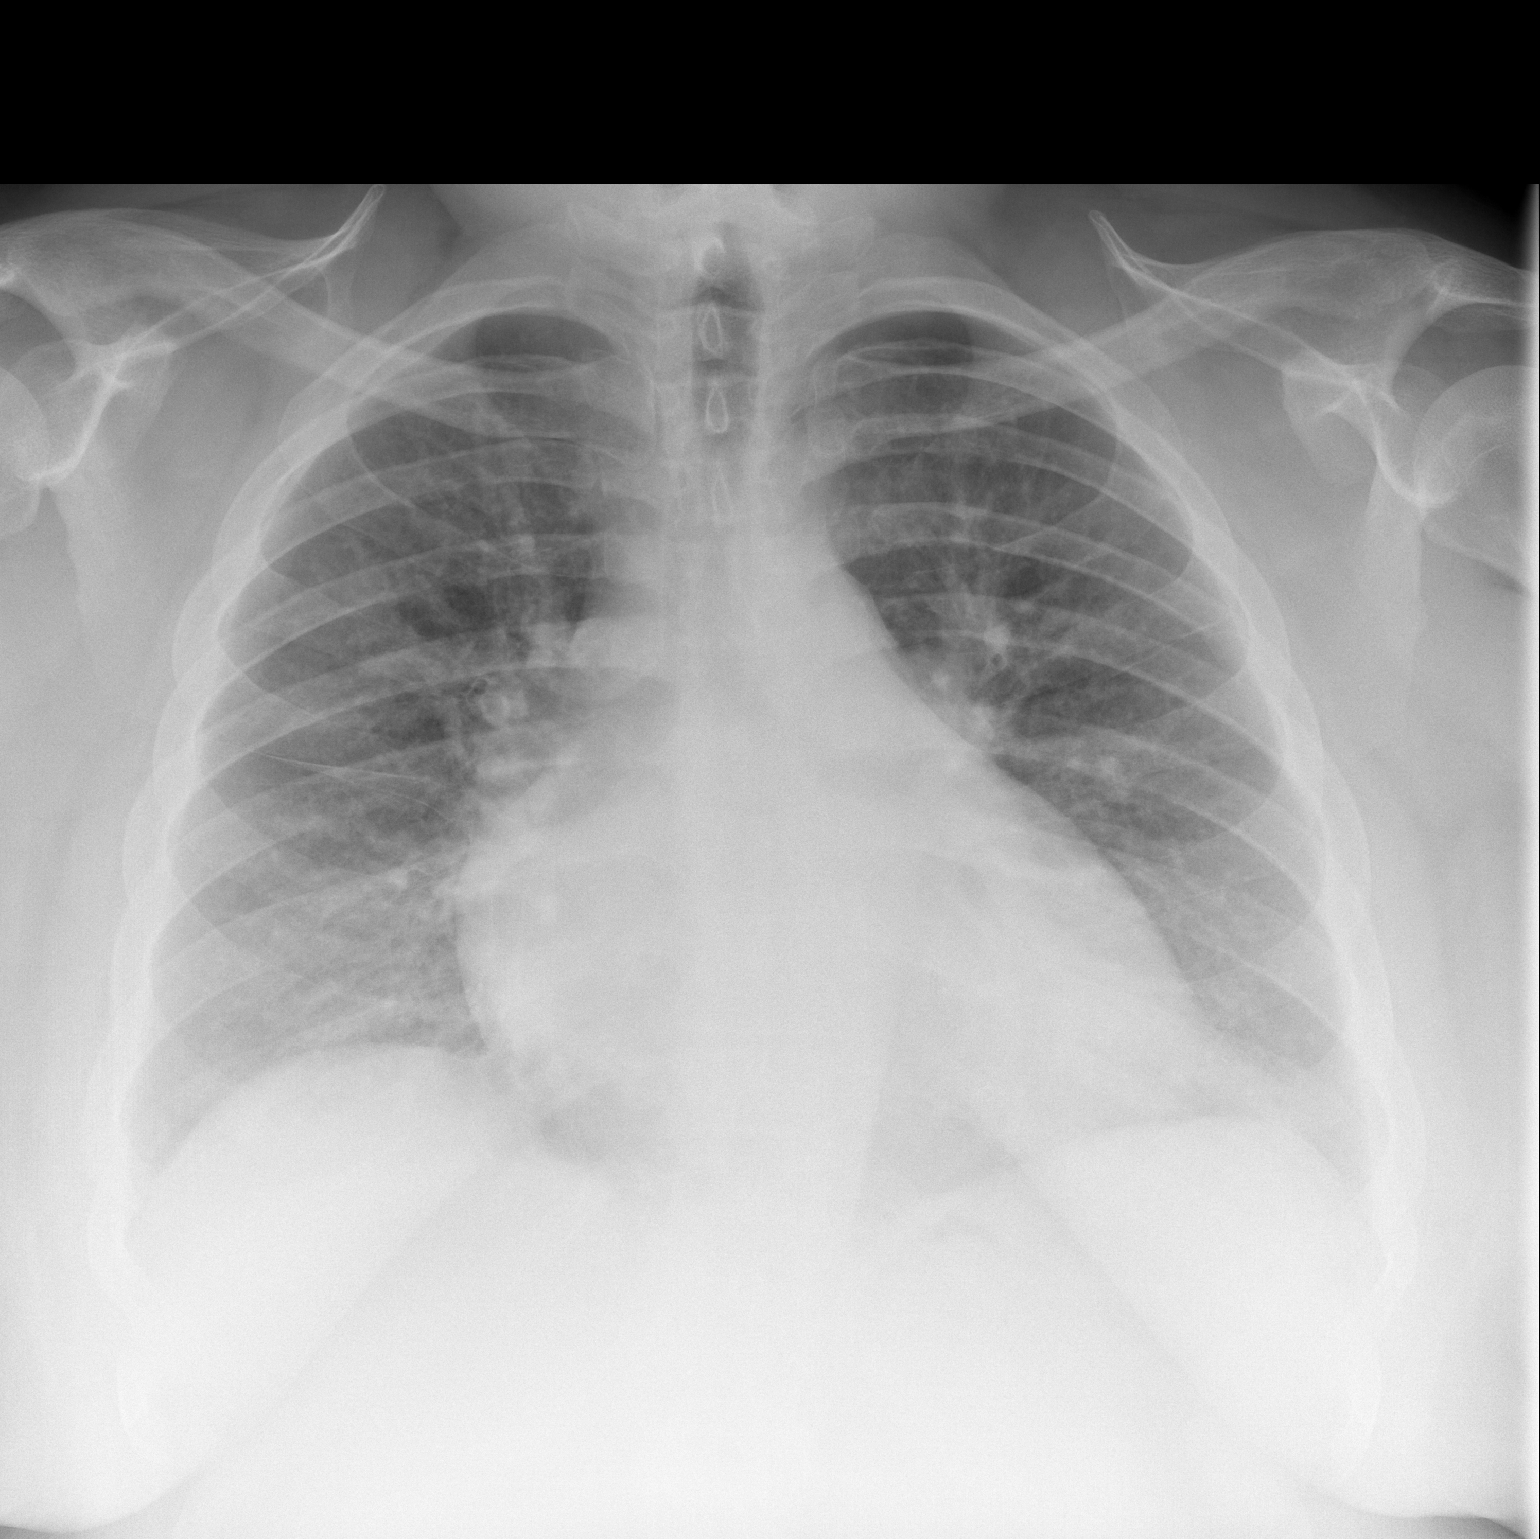

[w chest lat]
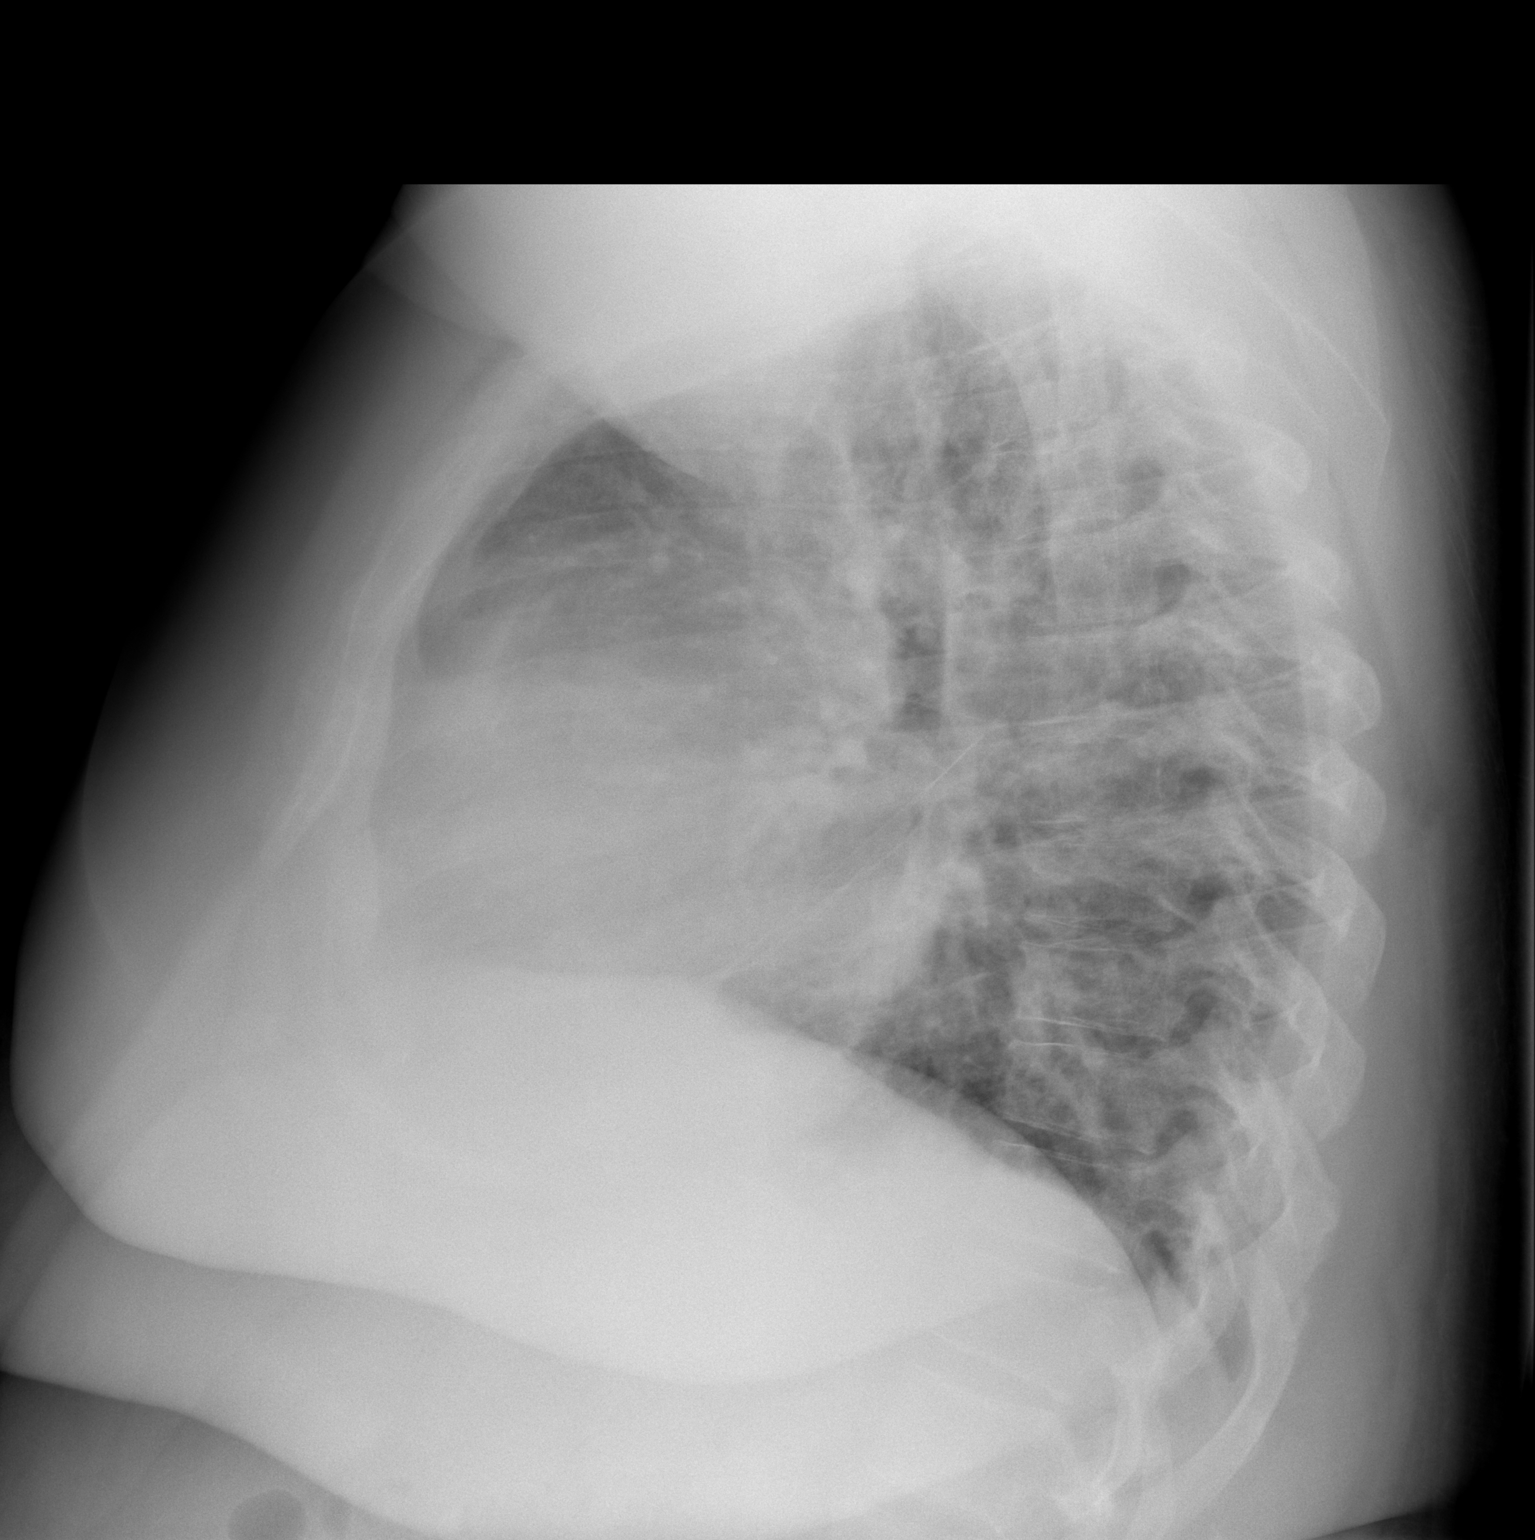

[2 of 2 positions shown; findings below may reference images not displayed]

FINDINGS: There is new cardiomegaly. There is peribronchial thickening.
Pulmonary vascularity is normal. No consolidative infiltrates or
effusions. No osseous abnormality.
IMPRESSION: Bronchitic changes.  New slight cardiomegaly.

## 2017-04-19 IMAGING — DX DG CHEST 2V
2 series · 2 of 2 positions shown · non-contrast
Comparison: Two-view chest x-ray 01/20/2015.

CLINICAL DATA: Congestion and back pain.  Cough

EXAM:
CHEST - 2 VIEW

[chest pa]
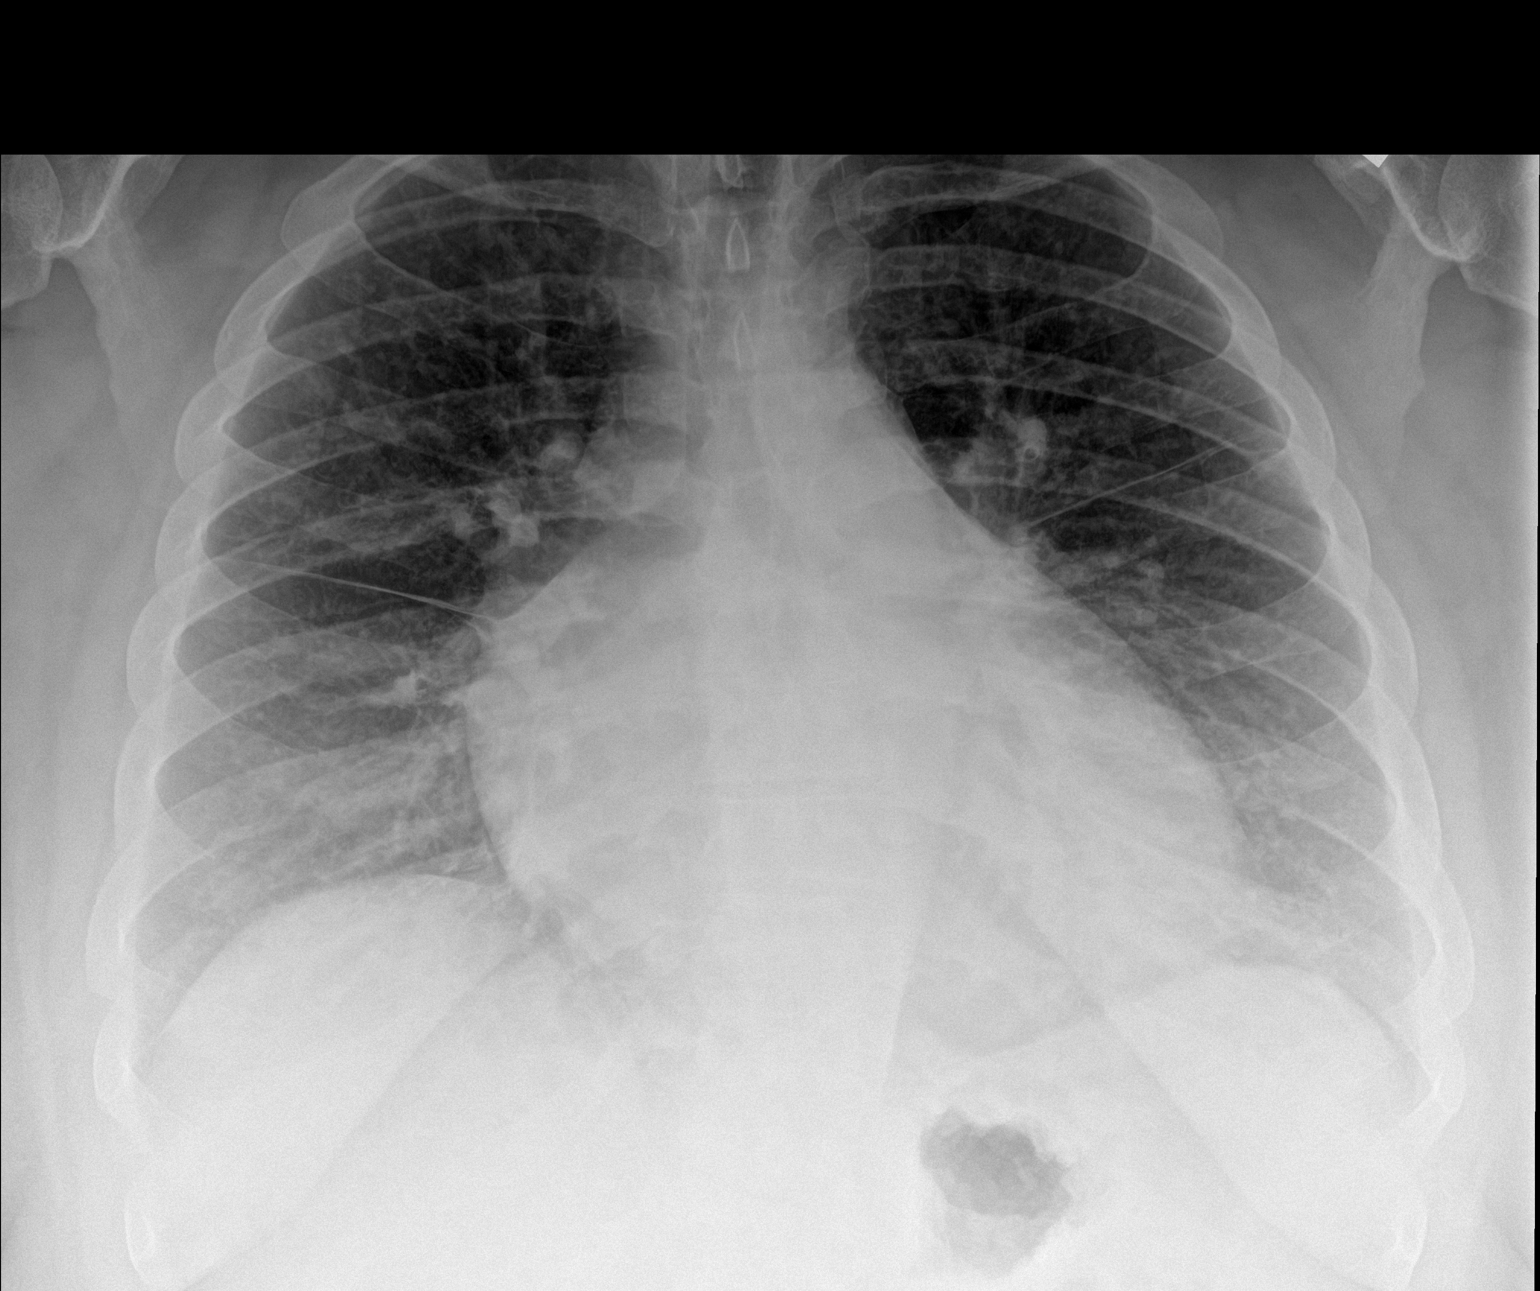

[chest lat]
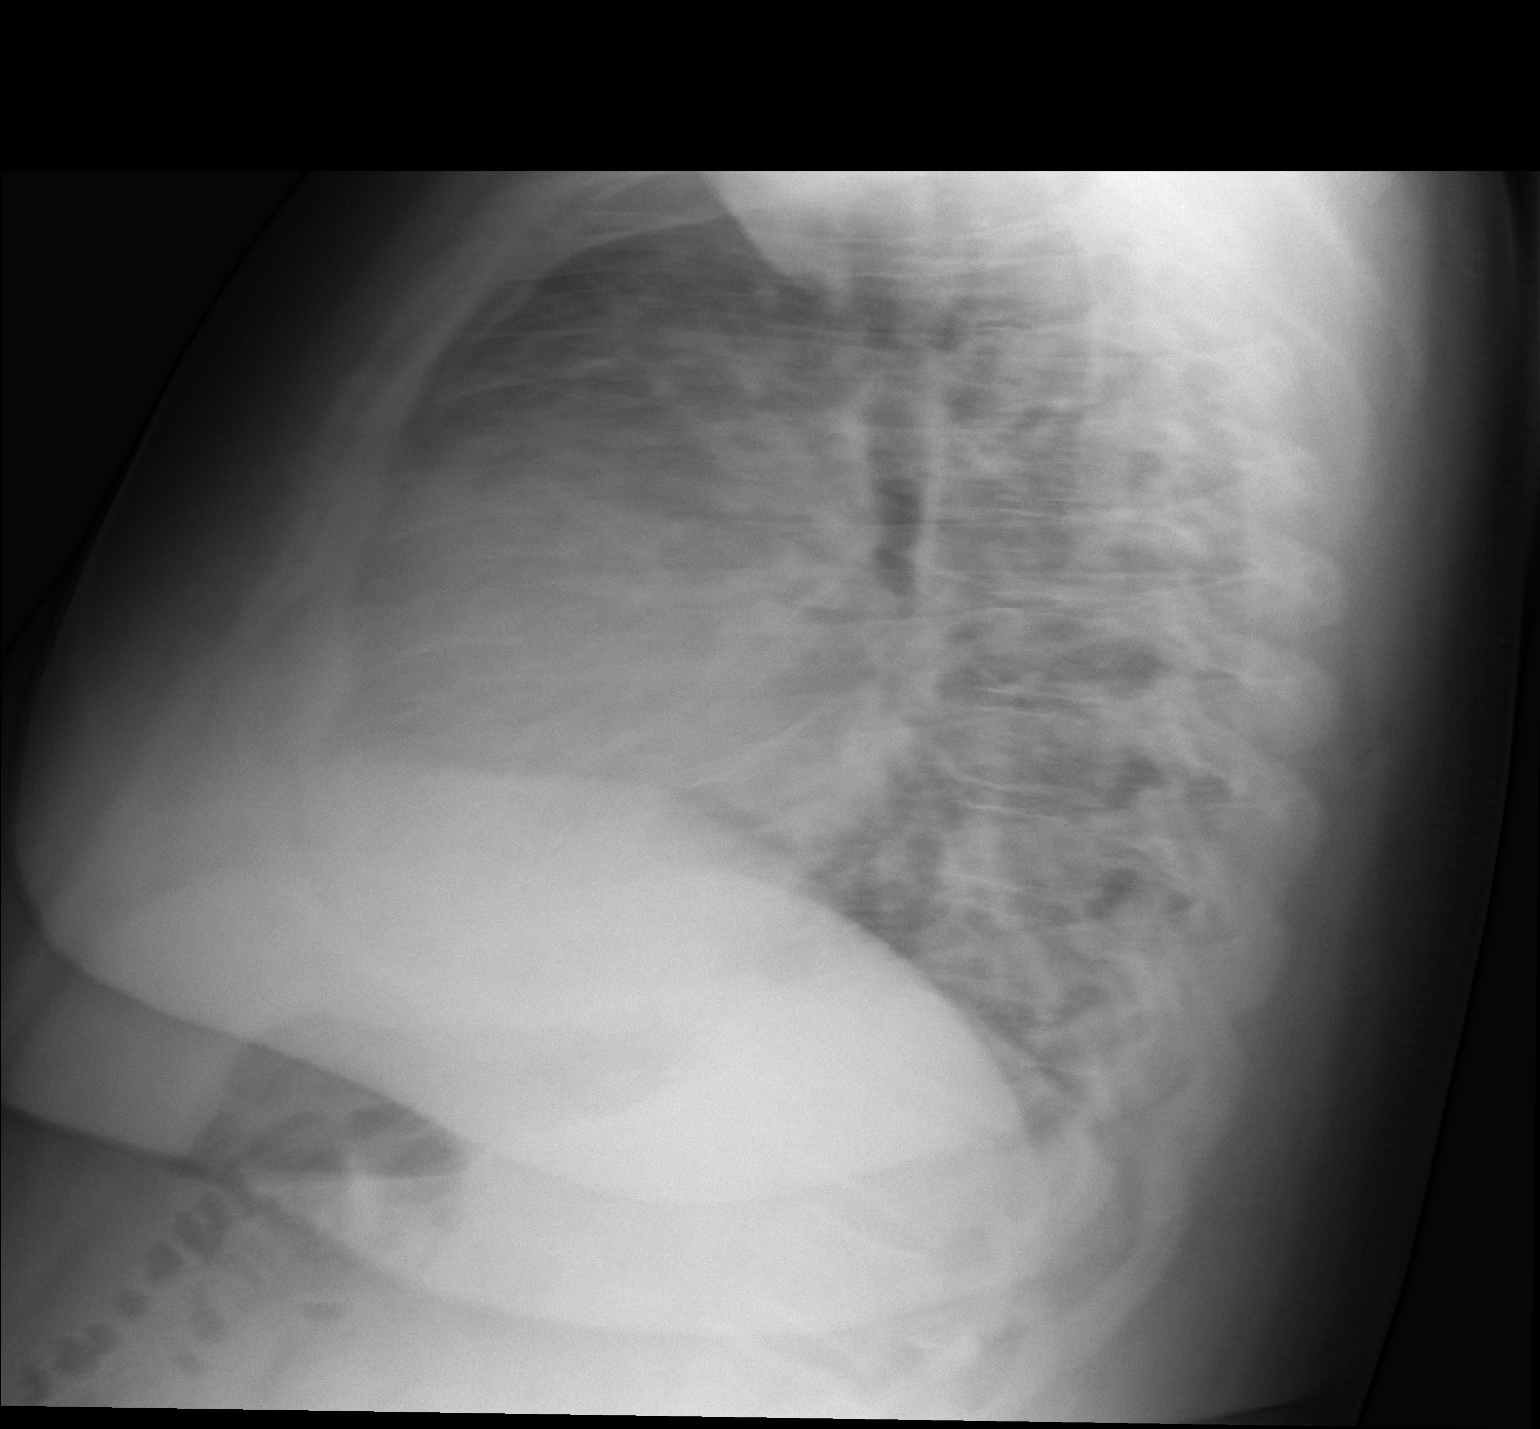

[2 of 2 positions shown; findings below may reference images not displayed]

FINDINGS: The heart is enlarged. Mild pulmonary vascular congestion is
present. There are no significant effusions. No focal airspace
consolidation is present. The visualized soft tissues and bony
thorax are unremarkable.
IMPRESSION: 1. Cardiomegaly and mild pulmonary vascular congestion suggesting
early congestive heart failure.
2. No focal airspace opacification.
